# Patient Record
Sex: Female | Born: 1995 | Hispanic: Yes | Marital: Single | Smoking: Never smoker
Health system: Southern US, Community
[De-identification: ages and names within clinical notes are randomized; demographics above are authoritative.]

## PROBLEM LIST (undated history)

## (undated) DIAGNOSIS — Z789 Other specified health status: Secondary | ICD-10-CM

## (undated) HISTORY — PX: NO PAST SURGERIES: SHX2092

---
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
## 2018-08-11 ENCOUNTER — Encounter (HOSPITAL_COMMUNITY): Payer: Self-pay | Admitting: Emergency Medicine

## 2018-08-11 ENCOUNTER — Emergency Department (HOSPITAL_COMMUNITY): Payer: BLUE CROSS/BLUE SHIELD

## 2018-08-11 ENCOUNTER — Other Ambulatory Visit: Payer: Self-pay

## 2018-08-11 ENCOUNTER — Emergency Department (HOSPITAL_COMMUNITY)
Admission: EM | Admit: 2018-08-11 | Discharge: 2018-08-11 | Disposition: A | Discharge status: Home / Self Care | Payer: BLUE CROSS/BLUE SHIELD | Attending: Emergency Medicine | Admitting: Emergency Medicine

## 2018-08-11 DIAGNOSIS — R112 Nausea with vomiting, unspecified: Secondary | ICD-10-CM | POA: Insufficient documentation

## 2018-08-11 DIAGNOSIS — N200 Calculus of kidney: Secondary | ICD-10-CM | POA: Insufficient documentation

## 2018-08-11 DIAGNOSIS — R1031 Right lower quadrant pain: Secondary | ICD-10-CM | POA: Insufficient documentation

## 2018-08-11 LAB — COMPREHENSIVE METABOLIC PANEL
ALBUMIN: 4.4 g/dL (ref 3.5–5.0)
ALT: 23 U/L (ref 0–44)
ANION GAP: 10 (ref 5–15)
AST: 28 U/L (ref 15–41)
Alkaline Phosphatase: 79 U/L (ref 38–126)
BUN: 8 mg/dL (ref 6–20)
CO2: 22 mmol/L (ref 22–32)
Calcium: 10.8 mg/dL — ABNORMAL HIGH (ref 8.9–10.3)
Chloride: 106 mmol/L (ref 98–111)
Creatinine, Ser: 0.78 mg/dL (ref 0.44–1.00)
GFR calc non Af Amer: >60 mL/min (ref >60)
Glucose, Bld: 124 mg/dL — ABNORMAL HIGH (ref 70–99)
Potassium: 3.7 mmol/L (ref 3.5–5.1)
SODIUM: 138 mmol/L (ref 135–145)
Total Bilirubin: 0.8 mg/dL (ref 0.3–1.2)
Total Protein: 7.4 g/dL (ref 6.5–8.1)

## 2018-08-11 LAB — TYPE AND SCREEN
ABO/RH(D): O POS
ANTIBODY SCREEN: NEGATIVE

## 2018-08-11 LAB — URINALYSIS, ROUTINE W REFLEX MICROSCOPIC
BILIRUBIN URINE: NEGATIVE
Glucose, UA: NEGATIVE mg/dL
Ketones, ur: NEGATIVE mg/dL
Nitrite: NEGATIVE
Protein, ur: NEGATIVE mg/dL
RBC / HPF: >50 RBC/hpf — ABNORMAL HIGH (ref 0–5)
Specific Gravity, Urine: 1.009 (ref 1.005–1.030)
pH: 6 (ref 5.0–8.0)

## 2018-08-11 LAB — CBC WITH DIFFERENTIAL/PLATELET
Abs Immature Granulocytes: 0.11 10*3/uL — ABNORMAL HIGH (ref 0.00–0.07)
BASOS ABS: 0.1 10*3/uL (ref 0.0–0.1)
BASOS PCT: 1 %
Eosinophils Absolute: 0.2 10*3/uL (ref 0.0–0.5)
Eosinophils Relative: 2 %
HCT: 42.8 % (ref 36.0–46.0)
Hemoglobin: 13.8 g/dL (ref 12.0–15.0)
Immature Granulocytes: 1 %
LYMPHS PCT: 27 %
Lymphs Abs: 3.5 10*3/uL (ref 0.7–4.0)
MCH: 28.6 pg (ref 26.0–34.0)
MCHC: 32.2 g/dL (ref 30.0–36.0)
MCV: 88.6 fL (ref 80.0–100.0)
MONO ABS: 1 10*3/uL (ref 0.1–1.0)
Monocytes Relative: 8 %
NRBC: 0 % (ref 0.0–0.2)
Neutro Abs: 8.2 10*3/uL — ABNORMAL HIGH (ref 1.7–7.7)
Neutrophils Relative %: 61 %
PLATELETS: 408 10*3/uL — AB (ref 150–400)
RBC: 4.83 MIL/uL (ref 3.87–5.11)
RDW: 12.5 % (ref 11.5–15.5)
WBC: 13.1 10*3/uL — ABNORMAL HIGH (ref 4.0–10.5)

## 2018-08-11 LAB — LIPASE, BLOOD: Lipase: 21 U/L (ref 11–51)

## 2018-08-11 LAB — I-STAT CG4 LACTIC ACID, ED
LACTIC ACID, VENOUS: 2.52 mmol/L — AB (ref 0.5–1.9)
LACTIC ACID, VENOUS: 2.03 mmol/L — AB (ref 0.5–1.9)

## 2018-08-11 LAB — ABO/RH: ABO/RH(D): O POS

## 2018-08-11 LAB — HCG, SERUM, QUALITATIVE: Preg, Serum: NEGATIVE

## 2018-08-11 MED ORDER — MORPHINE SULFATE (PF) 4 MG/ML IV SOLN
4.0000 mg | Freq: Once | INTRAVENOUS | Status: AC
  Administered 2018-08-11: 4 mg via INTRAVENOUS
  Filled 2018-08-11: qty 1

## 2018-08-11 MED ORDER — KETOROLAC TROMETHAMINE 30 MG/ML IJ SOLN
30.0000 mg | Freq: Once | INTRAMUSCULAR | Status: AC
  Administered 2018-08-11: 30 mg via INTRAVENOUS
  Filled 2018-08-11: qty 1

## 2018-08-11 MED ORDER — SODIUM CHLORIDE 0.9 % IV BOLUS
1000.0000 mL | Freq: Once | INTRAVENOUS | Status: AC
  Administered 2018-08-11: 1000 mL via INTRAVENOUS

## 2018-08-11 MED ORDER — OXYCODONE-ACETAMINOPHEN 5-325 MG PO TABS: 1.0000 | ORAL_TABLET | ORAL | 0 refills | Status: DC | PRN

## 2018-08-11 MED ORDER — IOPAMIDOL (ISOVUE-300) INJECTION 61%
100.0000 mL | Freq: Once | INTRAVENOUS | Status: AC | PRN
  Administered 2018-08-11: 100 mL via INTRAVENOUS

## 2018-08-11 MED ORDER — CEPHALEXIN 500 MG PO CAPS: 500.0000 mg | ORAL_CAPSULE | Freq: Two times a day (BID) | ORAL | 0 refills | Status: AC

## 2018-08-11 MED ORDER — ONDANSETRON HCL 4 MG/2ML IJ SOLN
4.0000 mg | Freq: Once | INTRAMUSCULAR | Status: AC
  Administered 2018-08-11: 4 mg via INTRAVENOUS
  Filled 2018-08-11: qty 2

## 2018-08-11 MED ORDER — ONDANSETRON HCL 4 MG PO TABS: 4.0000 mg | ORAL_TABLET | Freq: Three times a day (TID) | ORAL | 0 refills | Status: DC | PRN

## 2018-08-11 MED ORDER — TAMSULOSIN HCL 0.4 MG PO CAPS: 0.4000 mg | ORAL_CAPSULE | Freq: Every day | ORAL | 0 refills | Status: DC

## 2018-08-11 NOTE — Discharge Instructions (Addendum)
Your work-up today shows evidence of kidney stone.  There was also some urine backed up in your kidney.  Your kidney function was normal however after I spoke to urology they recommended we treat you with antibiotics, pain medicine, nausea medicine, and Flomax to try and pass this at home.  As your pain is improved we feel you are safe for discharge home.  If your symptoms worsen and are not controlled by the medications, if you develop fevers, or if anything changes or worsen significantly, please return to the nearest emergency department immediately.

## 2018-08-11 NOTE — ED Provider Notes (Signed)
[facility] EMERGENCY DEPARTMENT Provider Note   CSN: [id number] Arrival date & time: 08/11/18  0705     History   Chief Complaint Chief Complaint  Patient presents with  . Abdominal Pain  . Flank Pain    HPI Sherri Burton is a 22 y.o. female.  The history is provided by the patient, a parent and medical records. No language interpreter was used.  Abdominal Pain   This is a new problem. The current episode started 3 to 5 hours ago. The problem occurs constantly. The problem has not changed since onset.The pain is associated with an unknown factor. The pain is located in the RLQ. The quality of the pain is sharp and aching. The pain is at a severity of 8/10. The pain is severe. Associated symptoms include anorexia, nausea and vomiting. Pertinent negatives include fever, diarrhea, constipation, dysuria, frequency, hematuria and headaches. The symptoms are aggravated by palpation. Nothing relieves the symptoms.    History reviewed. No pertinent past medical history.  There are no active problems to display for this patient.   History reviewed. No pertinent surgical history.   OB History   No obstetric history on file.      Home Medications    Prior to Admission medications   Not on File    Family History History reviewed. No pertinent family history.  Social History Social History   Tobacco Use  . Smoking status: Never Smoker  Substance Use Topics  . Alcohol use: Yes    Alcohol/week: 1.0 standard drinks    Types: 1 Standard drinks or equivalent per week  . Drug use: Never     Allergies   Patient has no known allergies.   Review of Systems Review of Systems  Constitutional: Negative for chills, diaphoresis, fatigue and fever.  HENT: Negative for congestion.   Respiratory: Negative for cough, chest tightness, shortness of breath and wheezing.   Cardiovascular: Negative for chest pain, palpitations and leg swelling.    Gastrointestinal: Positive for abdominal pain, anorexia, nausea and vomiting. Negative for constipation and diarrhea.  Genitourinary: Negative for dysuria, flank pain, frequency and hematuria.  Musculoskeletal: Negative for back pain, neck pain and neck stiffness.  Skin: Negative for rash and wound.  Neurological: Negative for light-headedness, numbness and headaches.  Psychiatric/Behavioral: Negative for agitation.  All other systems reviewed and are negative.    Physical Exam Updated Vital Signs BP (!) 160/109 (BP Location: Right Arm)   Pulse 93   Temp 98.2 F (36.8 C) (Oral)   Resp 20   Ht 5\' 2"  (1.575 m)   Wt 82.6 kg   LMP 02/08/2017 (Approximate)   SpO2 100%   BMI 33.29 kg/m   Physical Exam Vitals signs and nursing note reviewed.  Constitutional:      General: She is not in acute distress.    Appearance: She is well-developed. She is ill-appearing. She is not toxic-appearing or diaphoretic.  HENT:     Head: Normocephalic and atraumatic.  Eyes:     Conjunctiva/sclera: Conjunctivae normal.  Neck:     Musculoskeletal: Neck supple.  Cardiovascular:     Rate and Rhythm: Normal rate and regular rhythm.     Heart sounds: No murmur.  Pulmonary:     Effort: Pulmonary effort is normal. No respiratory distress.     Breath sounds: Normal breath sounds. No wheezing, rhonchi or rales.  Chest:     Chest wall: No tenderness.  Abdominal:     General: Abdomen is flat.  Bowel sounds are normal. There is no distension.     Palpations: Abdomen is soft.     Tenderness: There is abdominal tenderness in the right lower quadrant. There is right CVA tenderness. There is no left CVA tenderness, guarding or rebound.  Skin:    General: Skin is warm and dry.     Capillary Refill: Capillary refill takes less than 2 seconds.  Neurological:     Mental Status: She is alert.      ED Treatments / Results  Labs (all labs ordered are listed, but only abnormal results are displayed) Labs  Reviewed  CBC WITH DIFFERENTIAL/PLATELET - Abnormal; Notable for the following components:      Result Value   WBC 13.1 (*)    Platelets 408 (*)    Neutro Abs 8.2 (*)    Abs Immature Granulocytes 0.11 (*)    All other components within normal limits  COMPREHENSIVE METABOLIC PANEL - Abnormal; Notable for the following components:   Glucose, Bld 124 (*)    Calcium 10.8 (*)    All other components within normal limits  URINALYSIS, ROUTINE W REFLEX MICROSCOPIC - Abnormal; Notable for the following components:   Hgb urine dipstick LARGE (*)    Leukocytes, UA TRACE (*)    RBC / HPF >50 (*)    Bacteria, UA FEW (*)    All other components within normal limits  I-STAT CG4 LACTIC ACID, ED - Abnormal; Notable for the following components:   Lactic Acid, Venous 2.52 (*)    All other components within normal limits  I-STAT CG4 LACTIC ACID, ED - Abnormal; Notable for the following components:   Lactic Acid, Venous 2.03 (*)    All other components within normal limits  URINE CULTURE  LIPASE, BLOOD  HCG, SERUM, QUALITATIVE  TYPE AND SCREEN  ABO/RH    EKG None  Radiology Ct Abdomen Pelvis W Contrast  Result Date: 08/11/2018 CLINICAL DATA:  Right upper quadrant abdominal pain, right flank pain EXAM: CT ABDOMEN AND PELVIS WITH CONTRAST TECHNIQUE: Multidetector CT imaging of the abdomen and pelvis was performed using the standard protocol following bolus administration of intravenous contrast. CONTRAST:  164mL ISOVUE-300 IOPAMIDOL (ISOVUE-300) INJECTION 61% COMPARISON:  None. FINDINGS: Lower chest: Lung bases are clear. No effusions. Heart is normal size. Hepatobiliary: No focal hepatic abnormality. Gallbladder unremarkable. Pancreas: No focal abnormality or ductal dilatation. Spleen: No focal abnormality.  Normal size. Adrenals/Urinary Tract: 3-4 mm proximal right ureteral stone with mild right hydronephrosis and perinephric stranding. No additional renal or ureteral stones. No hydronephrosis on  the left. Adrenal glands and urinary bladder unremarkable. Stomach/Bowel: Normal appendix. Stomach, large and small bowel grossly unremarkable. Vascular/Lymphatic: No evidence of aneurysm or adenopathy. Reproductive: Uterus and adnexa unremarkable.  No mass. Other: No free fluid or free air. Musculoskeletal: No acute bony abnormality. IMPRESSION: 3-4 mm proximal right ureteral stone with mild right hydronephrosis and perinephric stranding. Electronically Signed   By: Rolm Baptise M.D.   On: 08/11/2018 09:41    Procedures Procedures (including critical care time)  Medications Ordered in ED Medications  sodium chloride 0.9 % bolus 1,000 mL (0 mLs Intravenous Stopped 08/11/18 1130)  morphine 4 MG/ML injection 4 mg (4 mg Intravenous Given 08/11/18 0741)  ondansetron (ZOFRAN) injection 4 mg (4 mg Intravenous Given 08/11/18 0741)  iopamidol (ISOVUE-300) 61 % injection 100 mL (100 mLs Intravenous Contrast Given 08/11/18 0925)  ketorolac (TORADOL) 30 MG/ML injection 30 mg (30 mg Intravenous Given 08/11/18 1129)     Initial  Impression / Assessment and Plan / ED Course  I have reviewed the triage vital signs and the nursing notes.  Pertinent labs & imaging results that were available during my care of the patient were reviewed by me and considered in my medical decision making (see chart for details).     Kindel Rochefort is a 22 y.o. female with no significant past medical history who presents with nausea, vomiting, and severe right-sided abdominal pain.  Patient reports that she was feeling normal until 430 in this morning when she woke up having severe right abdomen and right lower quadrant abdominal pain.  She reports the pain is 8 out of 10 in severity and is aching and sharp.  She reports one episode of nausea and vomiting that looked dark.  She is unsure if it was bloody.  She denies any constipation or diarrhea and a normal bowel movement this morning trying to help with symptoms.  She denies  any urinary symptoms with no hematuria.  No history of kidney stones.  No prior history of appendicitis, gallbladder disease, diverticulitis, or other abdominal problems.  She denies preceding fevers or chills.  She denies any chest pain, shortness breath, congestion, or cough.  Denies recent trauma.  Denies any back pain.  Denies any other complaints.  She reports she has not had a menstrual cycle for over a year as she is on birth control.  She denies vaginal bleeding or vaginal discharge or other pelvic symptoms.  On exam, patient has exquisite tenderness in her right lower quadrant.  Patient has no left-sided abdominal tenderness.  Lungs clear and chest was nontender.  Bowel sounds were present.  Patient has no leg tenderness or leg swelling.  Patient does have some right CVA tenderness that is mild.  No left-sided symptoms.  Clinically most concerned about appendicitis.  Patient will have symptomatic medications with nausea medicine, pain medicine, and fluids.  She will be made n.p.o.  Patient will have labs and CT scan to further evaluate.  If CT is negative, will consider ultrasound to rule out gallbladder pathology missed on CT. suspect any kidney stone will be seen on CT.  10:01 AM Lactic acid elevated 2.52.  Fluids be continued.  Patient has leukocytosis of 13.  No anemia.  Metabolic panel shows slight hypercalcemia but otherwise normal kidney function liver function.  Lipase not elevated.  Patient is nonpregnant.  CT scan shows normal appendix with no appendicitis however it did show evidence of a proximal right 3 to 4 mm kidney stone with hydronephrosis and stranding.  Patient is awaiting urinalysis to determine if there is an infected stone present.  If there is an infected stone, will likely touch base with urology, if urine is reassuring, anticipate attempt at medical expulsion therapy as an outpatient and urology follow-up.  12:50 PM Urinalysis shows no nitrites.  There was evidence  of leukocytes and bacteria as well as bleeding.  Based on lack of fevers, chills, or dysuria, have lower suspicion for infected stone at this time.  Patient reports her pain is completely resolved and she was able to eat and drink without difficulty.  Suspect patient may have already passed stone versus pain is intermittent.  Lactic acid improved on reassessment.  Due to concern for possible infected stone, will touch base with urology.  As her pain is improved I suspect will be stable for discharge home for outpatient medical expulsion therapy.  Urology recommended antibiotics and discharge with follow-up in clinic.  As her  pain improved with a fill she was stable for discharge home and I agreed.  Patient had resolution of her discomfort.  Patient given prescription for pain medicine, nausea medicine, Flomax, and antibiotics.  Patient understood return precautions and was discharged in good condition.  Final Clinical Impressions(s) / ED Diagnoses   Final diagnoses:  Right lower quadrant abdominal pain  Non-intractable vomiting with nausea, unspecified vomiting type  Kidney stone    ED Discharge Orders         Ordered    cephALEXin (KEFLEX) 500 MG capsule  2 times daily     08/11/18 1339    oxyCODONE-acetaminophen (PERCOCET/ROXICET) 5-325 MG tablet  Every 4 hours PRN     08/11/18 1339    ondansetron (ZOFRAN) 4 MG tablet  Every 8 hours PRN     08/11/18 1339    tamsulosin (FLOMAX) 0.4 MG CAPS capsule  Daily     08/11/18 1340         Clinical Impression: 1. Right lower quadrant abdominal pain   2. Non-intractable vomiting with nausea, unspecified vomiting type   3. Kidney stone     Disposition: Discharge  Condition: Good  I have discussed the results, Dx and Tx plan with the pt(& family if present). He/she/they expressed understanding and agree(s) with the plan. Discharge instructions discussed at great length. Strict return precautions discussed and pt &/or family have  verbalized understanding of the instructions. No further questions at time of discharge.    Discharge Medication List as of 08/11/2018  1:59 PM    START taking these medications   Details  cephALEXin (KEFLEX) 500 MG capsule Take 1 capsule (500 mg total) by mouth 2 (two) times daily for 7 days., Starting Tue 08/11/2018, Until Tue 08/18/2018, Print    ondansetron (ZOFRAN) 4 MG tablet Take 1 tablet (4 mg total) by mouth every 8 (eight) hours as needed., Starting Tue 08/11/2018, Print    oxyCODONE-acetaminophen (PERCOCET/ROXICET) 5-325 MG tablet Take 1 tablet by mouth every 4 (four) hours as needed for severe pain., Starting Tue 08/11/2018, Print    tamsulosin (FLOMAX) 0.4 MG CAPS capsule Take 1 capsule (0.4 mg total) by mouth daily., Starting Tue 08/11/2018, Print        Follow Up: [facility]       , Gwenyth Allegra, MD 08/11/18 [phone]

## 2018-08-11 NOTE — ED Notes (Signed)
Patient given discharge instructions and verbalized understanding.  Patient stable to discharge at this time.  Patient is alert and oriented to baseline.  No distressed noted at this time.  All belongings taken with the patient at discharge.   

## 2018-08-11 NOTE — ED Triage Notes (Addendum)
Per Pt she woke up around 0430 with RUQ abdominal pain and right flank pain 7/10 sharp pain.  NAD noted at this time pt able to ambulate with out assistance did throw up once while in waiting room and reports the color was black.

## 2018-08-11 NOTE — ED Notes (Signed)
I-stat lactic acid results 2.03 reported Teagler, MD

## 2018-08-12 LAB — URINE CULTURE: Culture: NO GROWTH

## 2020-01-29 IMAGING — CT CT ABD-PELV W/ CM
2 of 4 series · 16 of 46 positions shown, 18 images · IV contrast (iopamidol)
Comparison: None.

CLINICAL DATA: Right upper quadrant abdominal pain, right flank
pain

EXAM:
CT ABDOMEN AND PELVIS WITH CONTRAST
TECHNIQUE: Multidetector CT imaging of the abdomen and pelvis was performed
using the standard protocol following bolus administration of
intravenous contrast.
CONTRAST:  100mL 1F5SKS-OTT IOPAMIDOL (1F5SKS-OTT) INJECTION 61%

[Series 3: a/p w/ 5mm · axial · 0.96mm/px · z∈[+902,+1397]mm · 13 of 109 slices shown, 15 images]
[im 5/109  soft-tissue]
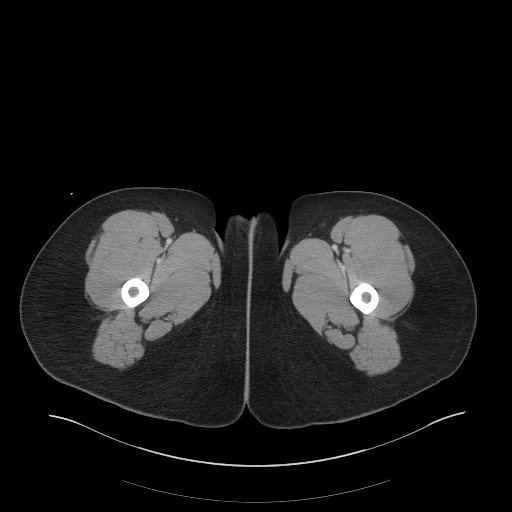
[im 5/109  bone]
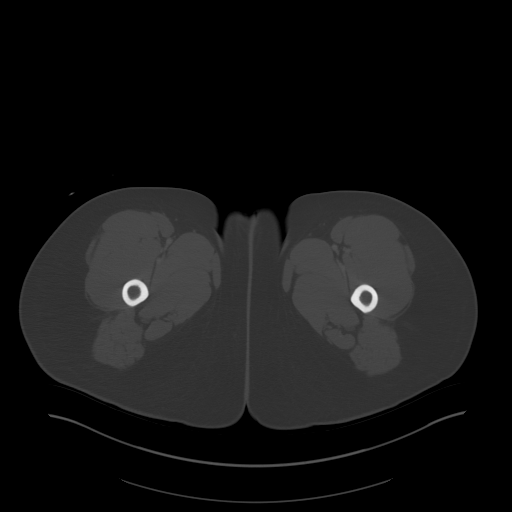
[im 13/109  soft-tissue]
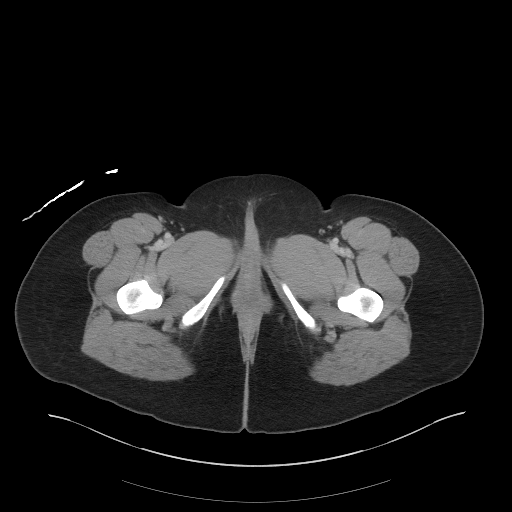
[im 21/109  soft-tissue]
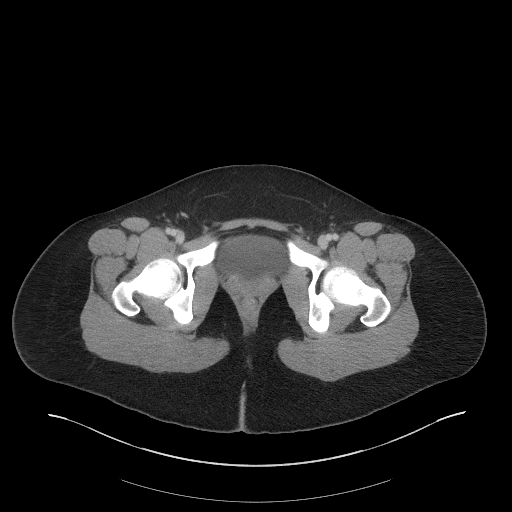
[im 30/109  soft-tissue]
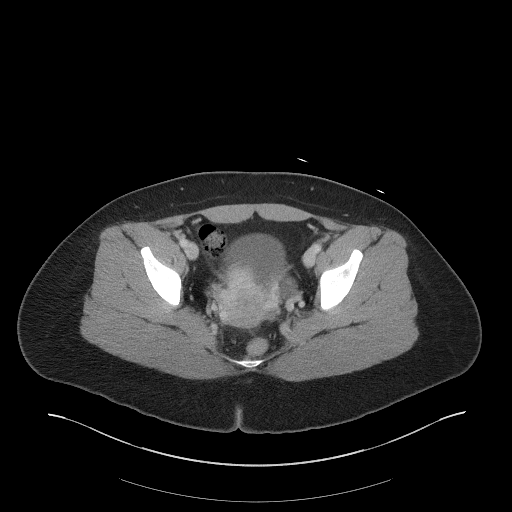
[im 38/109  soft-tissue]
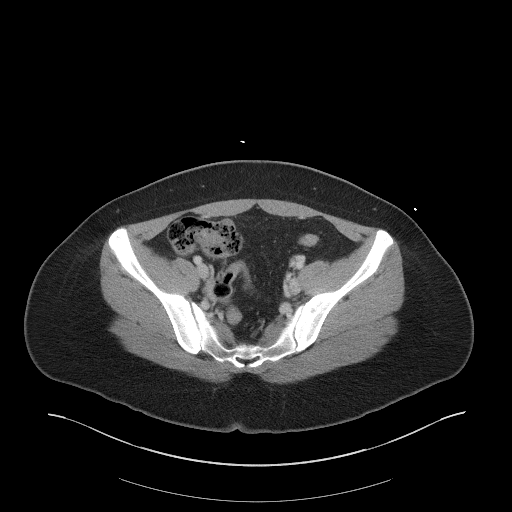
[im 46/109  soft-tissue]
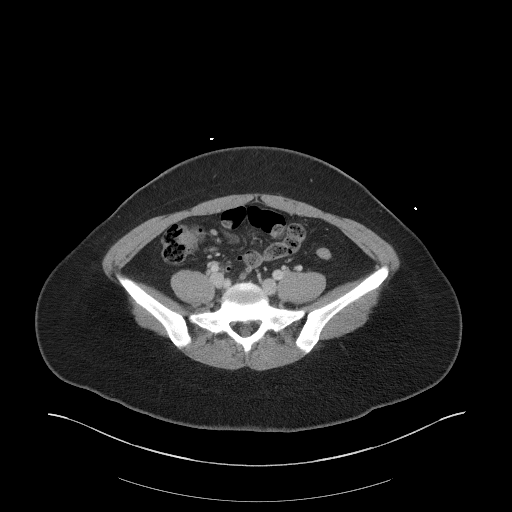
[im 55/109  soft-tissue]
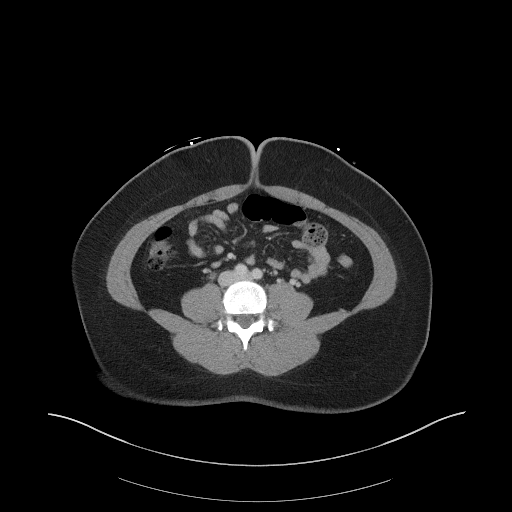
[im 63/109  soft-tissue]
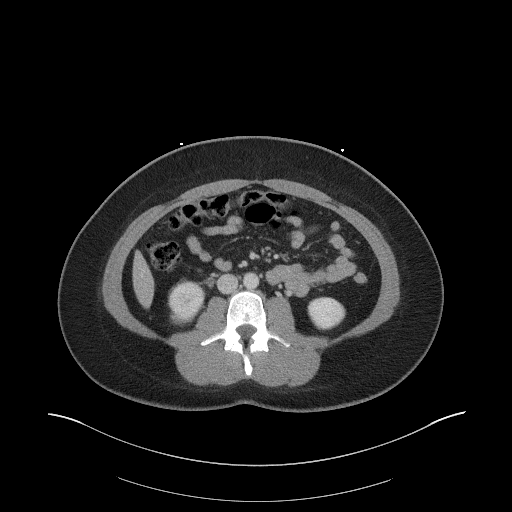
[im 71/109  soft-tissue]
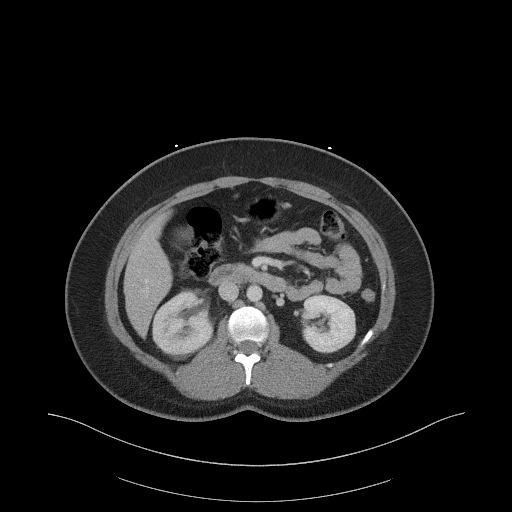
[im 71/109  bone]
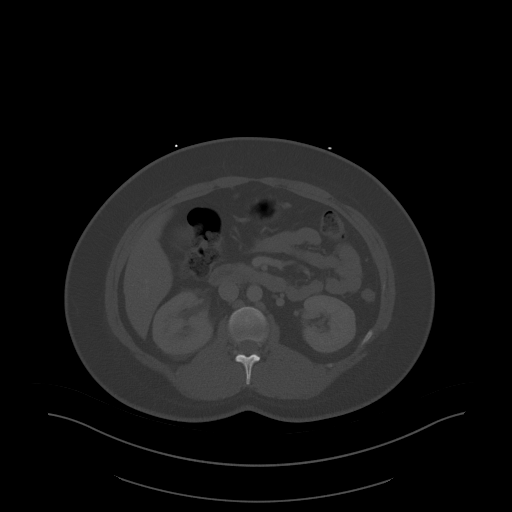
[im 79/109  soft-tissue]
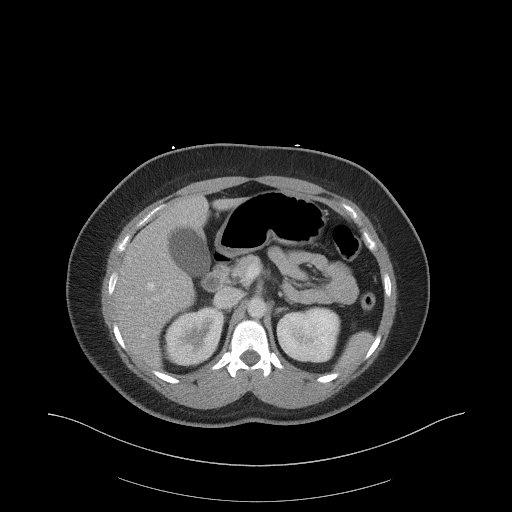
[im 88/109  soft-tissue]
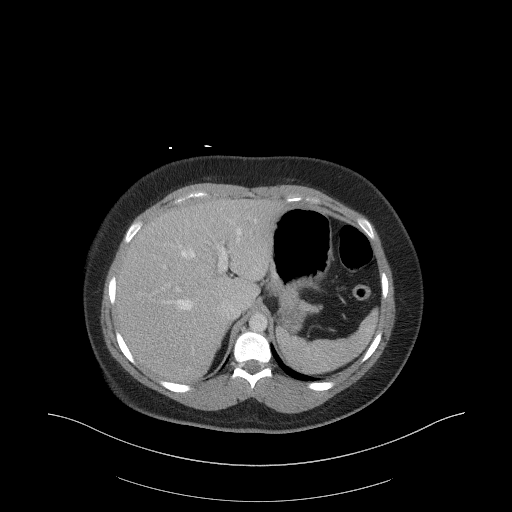
[im 96/109  soft-tissue]
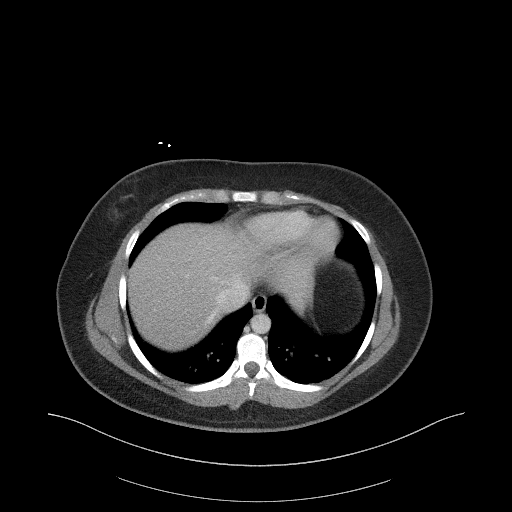
[im 104/109  soft-tissue]
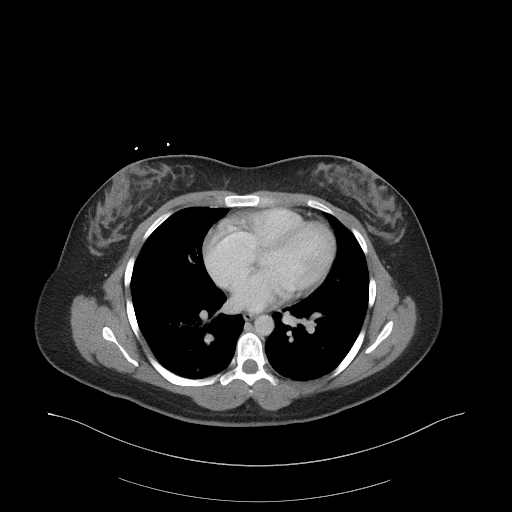

[Series 6: a/p w/ cor · coronal · 0.97mm/px · 3 of 186 slices shown]
[im 62/186  soft-tissue]
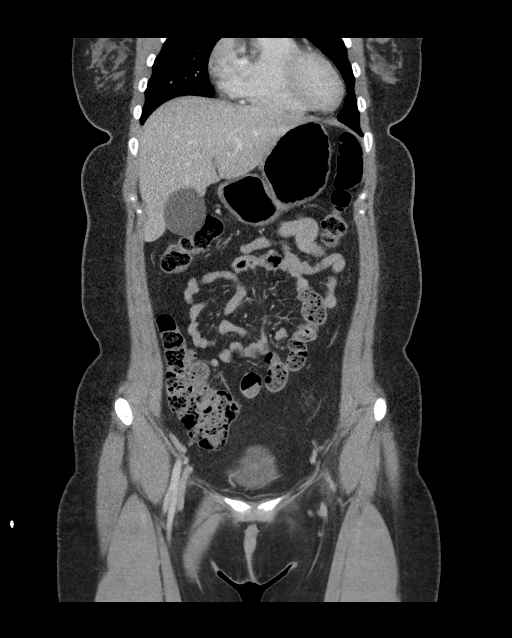
[im 83/186  soft-tissue]
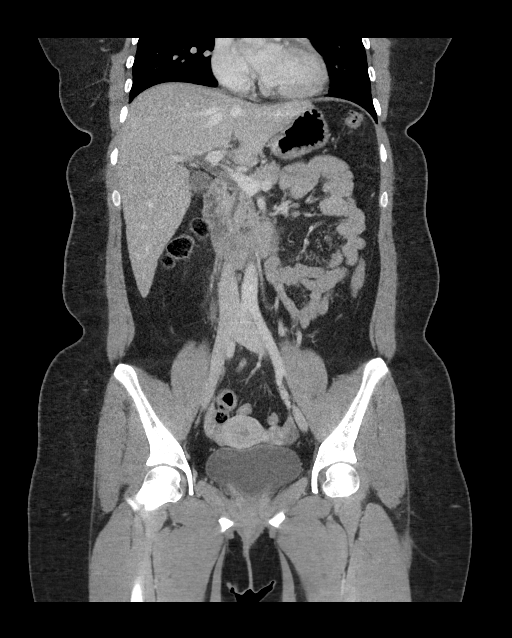
[im 103/186  soft-tissue]
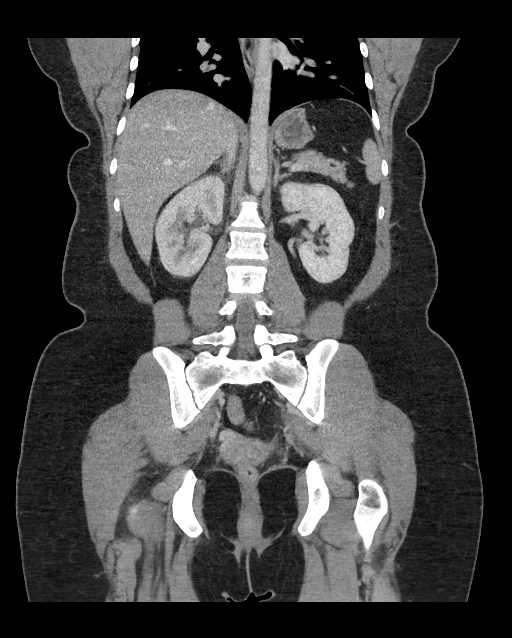

[16 of 46 positions shown; findings below may reference images not displayed]

FINDINGS: Lower chest: Lung bases are clear. No effusions. Heart is normal
size.

Hepatobiliary: No focal hepatic abnormality. Gallbladder
unremarkable.

Pancreas: No focal abnormality or ductal dilatation.

Spleen: No focal abnormality.  Normal size.

Adrenals/Urinary Tract: 3-4 mm proximal right ureteral stone with
mild right hydronephrosis and perinephric stranding. No additional
renal or ureteral stones. No hydronephrosis on the left. Adrenal
glands and urinary bladder unremarkable.

Stomach/Bowel: Normal appendix. Stomach, large and small bowel
grossly unremarkable.

Vascular/Lymphatic: No evidence of aneurysm or adenopathy.

Reproductive: Uterus and adnexa unremarkable.  No mass.

Other: No free fluid or free air.

Musculoskeletal: No acute bony abnormality.
IMPRESSION: 3-4 mm proximal right ureteral stone with mild right hydronephrosis
and perinephric stranding.

## 2021-09-11 ENCOUNTER — Ambulatory Visit
Admission: RE | Admit: 2021-09-11 | Discharge: 2021-09-11 | Disposition: A | Discharge status: Home / Self Care | Payer: BC Managed Care – PPO | Source: Ambulatory Visit | Attending: Emergency Medicine | Admitting: Emergency Medicine

## 2021-09-11 ENCOUNTER — Other Ambulatory Visit: Payer: Self-pay

## 2021-09-11 VITALS — BP 135/87 | HR 77 | Temp 98.3°F | Resp 16

## 2021-09-11 DIAGNOSIS — Z3201 Encounter for pregnancy test, result positive: Secondary | ICD-10-CM | POA: Diagnosis not present

## 2021-09-11 LAB — POCT URINE PREGNANCY: Preg Test, Ur: POSITIVE — AB

## 2021-09-11 MED ORDER — PRENATAL VITAMIN 27-0.8 MG PO TABS: 1.0000 | ORAL_TABLET | Freq: Every day | ORAL | 0 refills | Status: DC

## 2021-09-11 NOTE — ED Provider Notes (Signed)
HPI  SUBJECTIVE:  Sherri Burton is a 26 y.o. female who presents for pregnancy test.  She had 2 positive home pregnancy test and 1 negative one.  LMP: December 17, she is normally irregular.  She has not been on any birth control since November.  She reports increased sensitivity to smells and breast tenderness starting last week.  No nausea, vomiting, abdominal pain, cramping, contractions, vaginal bleeding, odor, discharge.  She is in a long-term monogamous relationship with her husband.  She has never been pregnant before.  She has no past medical history.  No history of STDs, PID.  PMD: [employer] community.    History reviewed. No pertinent past medical history.  History reviewed. No pertinent surgical history.  Family History  Problem Relation Age of Onset   Cancer Mother    Healthy Father     Social History   Tobacco Use   Smoking status: Never  Substance Use Topics   Alcohol use: Yes    Alcohol/week: 1.0 standard drink    Types: 1 Standard drinks or equivalent per week   Drug use: Never    No current facility-administered medications for this encounter.  Current Outpatient Medications:    Prenatal Vit-Fe Fumarate-FA (PRENATAL VITAMIN) 27-0.8 MG TABS, Take 1 tablet by mouth daily., Disp: 30 tablet, Rfl: 0  No Known Allergies   ROS  As noted in HPI.   Physical Exam  BP 135/87 (BP Location: Right Arm)    Pulse 77    Temp 98.3 F (36.8 C) (Oral)    Resp 16    LMP 07/28/2021 (Exact Date)    SpO2 98%   Constitutional: Well developed, well nourished, no acute distress Eyes:  EOMI, conjunctiva normal bilaterally HENT: Normocephalic, atraumatic,mucus membranes moist Respiratory: Normal inspiratory effort Cardiovascular: Normal rate GI: nondistended soft.  No suprapubic, flank tenderness. Back: No CVAT skin: No rash, skin intact Musculoskeletal: no deformities Neurologic: Alert & oriented x 3, no focal neuro deficits Psychiatric: Speech and behavior  appropriate   ED Course   Medications - No data to display  Orders Placed This Encounter  Procedures   POCT urine pregnancy    Standing Status:   Standing    Number of Occurrences:   1    Results for orders placed or performed during the hospital encounter of 09/11/21 (from the past 24 hour(s))  POCT urine pregnancy     Status: Abnormal   Collection Time: 09/11/21  6:01 PM  Result Value Ref Range   Preg Test, Ur Positive (A) Negative   No results found.  ED Clinical Impression  1. Positive urine pregnancy test      ED Assessment/Plan  Urine pregnancy positive.  [facility] [place] [facility] steps.  will start her on prenatal vitamins in the meantime.   Discussed labs,  MDM, treatment plan, and plan for follow-up with patient. Discussed sn/sx that should prompt return to the ED. patient agrees with plan.   Meds ordered this encounter  Medications   Prenatal Vit-Fe Fumarate-FA (PRENATAL VITAMIN) 27-0.8 MG TABS    Sig: Take 1 tablet by mouth daily.    Dispense:  30 tablet    Refill:  0      *This clinic note was created using [employer]. Therefore, there may be occasional mistakes despite careful proofreading.  ?    Melynda Ripple, MD 09/13/21 [phone]

## 2021-09-11 NOTE — Discharge Instructions (Addendum)
[facility] [place] ASAP for next steps.  You can take the prenatal vitamins.  Any brand of prenatal vitamin will do.  It may cause constipation as it has extra iron in it.  Go immediately to the MAU at [facility] if you start having vaginal bleeding, abdominal pain, if you pass out, or for other concerns

## 2021-09-11 NOTE — ED Triage Notes (Signed)
Pt presents for pregnancy test. States she took 3 pregnancy teste between yesterday and today, 2 were positive 1 was negative.

## 2021-09-17 ENCOUNTER — Inpatient Hospital Stay (HOSPITAL_COMMUNITY)
Admission: EM | Admit: 2021-09-17 | Discharge: 2021-09-17 | Disposition: A | Discharge status: Home / Self Care | Payer: BC Managed Care – PPO | Attending: Family Medicine | Admitting: Family Medicine

## 2021-09-17 ENCOUNTER — Encounter (HOSPITAL_COMMUNITY): Payer: Self-pay | Admitting: Physician Assistant

## 2021-09-17 ENCOUNTER — Inpatient Hospital Stay (HOSPITAL_COMMUNITY): Payer: BC Managed Care – PPO

## 2021-09-17 ENCOUNTER — Other Ambulatory Visit: Payer: Self-pay

## 2021-09-17 ENCOUNTER — Ambulatory Visit
Admission: EM | Admit: 2021-09-17 | Discharge: 2021-09-17 | Disposition: A | Discharge status: Home / Self Care | Payer: BC Managed Care – PPO

## 2021-09-17 DIAGNOSIS — O0281 Inappropriate change in quantitative human chorionic gonadotropin (hCG) in early pregnancy: Secondary | ICD-10-CM

## 2021-09-17 DIAGNOSIS — O039 Complete or unspecified spontaneous abortion without complication: Secondary | ICD-10-CM | POA: Insufficient documentation

## 2021-09-17 DIAGNOSIS — O209 Hemorrhage in early pregnancy, unspecified: Secondary | ICD-10-CM

## 2021-09-17 HISTORY — DX: Other specified health status: Z78.9

## 2021-09-17 LAB — URINALYSIS, MICROSCOPIC (REFLEX): RBC / HPF: >50 RBC/hpf (ref 0–5)

## 2021-09-17 LAB — WET PREP, GENITAL
Clue Cells Wet Prep HPF POC: NONE SEEN
Sperm: NONE SEEN
Trich, Wet Prep: NONE SEEN
WBC, Wet Prep HPF POC: <10 (ref <10)
Yeast Wet Prep HPF POC: NONE SEEN

## 2021-09-17 LAB — CBC
HCT: 40 % (ref 36.0–46.0)
Hemoglobin: 13.1 g/dL (ref 12.0–15.0)
MCH: 29.2 pg (ref 26.0–34.0)
MCHC: 32.8 g/dL (ref 30.0–36.0)
MCV: 89.3 fL (ref 80.0–100.0)
Platelets: 432 10*3/uL — ABNORMAL HIGH (ref 150–400)
RBC: 4.48 MIL/uL (ref 3.87–5.11)
RDW: 12.7 % (ref 11.5–15.5)
WBC: 13.3 10*3/uL — ABNORMAL HIGH (ref 4.0–10.5)
nRBC: 0 % (ref 0.0–0.2)

## 2021-09-17 LAB — URINALYSIS, ROUTINE W REFLEX MICROSCOPIC
Bilirubin Urine: NEGATIVE
Glucose, UA: NEGATIVE mg/dL
Ketones, ur: NEGATIVE mg/dL
Leukocytes,Ua: NEGATIVE
Nitrite: NEGATIVE
Protein, ur: 30 mg/dL — AB
Specific Gravity, Urine: >1.03 — ABNORMAL HIGH (ref 1.005–1.030)
pH: 5.5 (ref 5.0–8.0)

## 2021-09-17 LAB — HCG, QUANTITATIVE, PREGNANCY: hCG, Beta Chain, Quant, S: 1 m[IU]/mL (ref <5)

## 2021-09-17 NOTE — MAU Note (Signed)
Presents with c/o VB that began this morning.  States had spotting over the weekend but heavier today.  Denies saturating through sanitary napkin or passing clots.

## 2021-09-17 NOTE — ED Triage Notes (Signed)
Pt arrives POV for eval of vag bleeding and cramping. LMP 12/17, states 7 weeks preg. [facility]

## 2021-09-17 NOTE — ED Provider Notes (Signed)
Emergency Medicine Provider OB Triage Evaluation Note,    Sherri Burton is a 26 y.o. female, G1P0, at Unknown gestation who presents to the emergency department with complaints of vaginal bleeding.  She reports that it started as spotting yesterday and is now bleeding like a period.  She reports cramping. No ultrasounds this pregnancy,   Positive test in the system from 09/11/21  Physical Exam  BP (!) 146/94 (BP Location: Right Arm)    Pulse 98    Temp 98.1 F (36.7 C) (Oral)    Resp 17    LMP 07/28/2021 (Exact Date)    SpO2 98%  temp 98.3, spo2 96 HR 87 BP 146/94 General: Awake, no distress  HEENT: Atraumatic  Resp: Normal effort  Cardiac: Normal rate Abd: Nondistended, nontender  MSK: Moves all extremities without difficulty Neuro: Speech clear  Medical Decision Making  Pt evaluated for pregnancy concern and is stable for transfer to MAU. Pt is in agreement with plan for transfer.  2:22 PM Discussed with MAU APP, Danielle , who accepts patient in transfer.  Clinical Impression   1. Vaginal bleeding affecting early pregnancy        Ollen Gross 09/17/21 1422    Sherwood Gambler, MD 09/18/21 [phone]

## 2021-09-17 NOTE — MAU Provider Note (Addendum)
History     CSN: [id number]  Arrival date and time: 09/17/21 1402   Event Date/Time   First Provider Initiated Contact with Patient 09/17/21 1527      Chief Complaint  Patient presents with   Vaginal Bleeding   Vaginal Bleeding The patient's pertinent negatives include no vaginal discharge. Associated symptoms include abdominal pain. Pertinent negatives include no chills, dysuria, fever, headaches or hematuria.   Sherri Burton is a 26yo G1P0 at [redacted]w[redacted]d who presents for vaginal bleeding and cramping since yesterday. She had some spotting and blood when she wiped yesterday. She found that she had bled through her pants at work today. The quantity of today's bleeding is comparable to the first few days of her period. She has not noticed any clots or tissue. The cramping pain is lower abdominal and is intermittent. She has not had any fevers, itching, burning with urination, or vaginal discharge.   OB History     Gravida  1   Para      Term      Preterm      AB      Living         SAB      IAB      Ectopic      Multiple      Live Births              Past Medical History:  Diagnosis Date   Medical history non-contributory     Past Surgical History:  Procedure Laterality Date   NO PAST SURGERIES      Family History  Problem Relation Age of Onset   Cancer Mother    Healthy Father     Social History   Tobacco Use   Smoking status: Never  Substance Use Topics   Alcohol use: Not Currently    Alcohol/week: 1.0 standard drink    Types: 1 Standard drinks or equivalent per week   Drug use: Never    Allergies: No Known Allergies  Medications Prior to Admission  Medication Sig Dispense Refill Last Dose   Prenatal Vit-Fe Fumarate-FA (PRENATAL VITAMIN) 27-0.8 MG TABS Take 1 tablet by mouth daily. 30 tablet 0 09/16/2021    Review of Systems  Constitutional:  Negative for chills and fever.  Eyes:  Negative for visual disturbance.  Respiratory:   Negative for shortness of breath.   Cardiovascular:  Negative for chest pain.  Gastrointestinal:  Positive for abdominal pain.  Genitourinary:  Positive for vaginal bleeding. Negative for dysuria, hematuria and vaginal discharge.  Neurological:  Negative for headaches.   Physical Exam   Blood pressure 132/85, pulse 84, temperature 98.2 F (36.8 C), temperature source Oral, resp. rate 18, height 5\' 2"  (1.575 m), weight 90.3 kg, last menstrual period 07/28/2021, SpO2 98 %.  Physical Exam Constitutional:      General: She is not in acute distress.    Appearance: Normal appearance. She is not ill-appearing.  HENT:     Head: Normocephalic and atraumatic.  Pulmonary:     Effort: Pulmonary effort is normal.  Abdominal:     Palpations: Abdomen is soft.     Tenderness: There is abdominal tenderness in the suprapubic area.  Skin:    General: Skin is warm and dry.  Neurological:     General: No focal deficit present.     Mental Status: She is alert.    Results for orders placed or performed during the hospital encounter of 09/17/21 (from the past 24 hour(s))  CBC     Status: Abnormal   Collection Time: 09/17/21  3:31 PM  Result Value Ref Range   WBC 13.3 (H) 4.0 - 10.5 K/uL   RBC 4.48 3.87 - 5.11 MIL/uL   Hemoglobin 13.1 12.0 - 15.0 g/dL   HCT 40.0 36.0 - 46.0 %   MCV 89.3 80.0 - 100.0 fL   MCH 29.2 26.0 - 34.0 pg   MCHC 32.8 30.0 - 36.0 g/dL   RDW 12.7 11.5 - 15.5 %   Platelets 432 (H) 150 - 400 K/uL   nRBC 0.0 0.0 - 0.2 %  Wet prep, genital     Status: None   Collection Time: 09/17/21  3:34 PM   Specimen: Vaginal  Result Value Ref Range   Yeast Wet Prep HPF POC NONE SEEN NONE SEEN   Trich, Wet Prep NONE SEEN NONE SEEN   Clue Cells Wet Prep HPF POC NONE SEEN NONE SEEN   WBC, Wet Prep HPF POC <10 <10   Sperm NONE SEEN      US OB LESS THAN 14 WEEKS WITH OB TRANSVAGINAL  Result Date: 09/17/2021 CLINICAL DATA:  Vaginal bleeding, spotting.  Pending beta HCG EXAM: OBSTETRIC <14  WK [place] AND TRANSVAGINAL OB US TECHNIQUE: Both transabdominal and transvaginal ultrasound examinations were performed for complete evaluation of the gestation as well as the maternal uterus, adnexal regions, and pelvic cul-de-sac. Transvaginal technique was performed to assess early pregnancy. COMPARISON:  None. FINDINGS: Intrauterine gestational sac: None Yolk sac:  Not Visualized. Embryo:  Not Visualized. Maternal uterus/adnexae: Anteverted uterus. Endometrial thickness of 5 mm. No intrauterine gestational sac identified. Left ovary measures 2.7 x 2.3 x 2.5 cm and appears unremarkable. Right ovary measures 2.6 x 2.5 x 2.9 cm and appears unremarkable. No adnexal masses. No free fluid within the pelvis. IMPRESSION: No intrauterine pregnancy or findings suspicious for ectopic pregnancy. In the setting of a positive beta HCG, findings would be consistent with pregnancy of unknown location and may reflect early intrauterine pregnancy not yet visualized sonographically, occult ectopic pregnancy, or failed pregnancy. Recommend trending of beta HCG as well as follow-up ultrasound in 7-10 days based on clinical course. Electronically Signed   By: Davina Poke D.O.   On: 09/17/2021 15:55     MAU Course  Procedures  MDM Transabdominal and transvaginal ultrasound showed no intrauterine pregnancy and hCG 1, most suggestive of complete early abortion.  - UA cloudy with large Hgb and protein, likely contamination from vaginal bleeding - CBC with WBC 13.3 and platelets 432 but normal Hgb - hCG 1 - Wet prep normal - GC/chlamydia pending  Assessment and Plan  Complete abortion - With hCG <5, no OB follow-up required - Shared information about [facility] for birth control options and OB/gyn care  Reggy Eye, Medical Student 09/17/2021, 4:11 PM   Attestation of Supervision of Student:  I confirm that I have verified the information documented in the medical students note and that I have also personally reperformed  the history, physical exam and all medical decision making activities.  I have verified that all services and findings are accurately documented in this student's note; and I agree with management and plan as outlined in the documentation. I have also made any necessary editorial changes.  Marcille Buffy DNP, CNM  09/17/21  5:39 PM

## 2021-09-18 LAB — GC/CHLAMYDIA PROBE AMP (~~LOC~~) NOT AT ARMC
Chlamydia: NEGATIVE
Comment: NEGATIVE
Comment: NORMAL
Neisseria Gonorrhea: NEGATIVE

## 2021-10-03 ENCOUNTER — Encounter: Payer: Self-pay | Admitting: *Deleted

## 2021-10-03 ENCOUNTER — Telehealth: Payer: BC Managed Care – PPO

## 2021-10-03 NOTE — Progress Notes (Unsigned)
Per chart review in prep for new ob intake virtual visit discovered patient has miscarriage confirmed by mau visit 09/17/21. Appointments cancelled. Sherri Burton

## 2021-10-17 ENCOUNTER — Encounter: Payer: BC Managed Care – PPO | Admitting: Certified Nurse Midwife

## 2021-11-10 HISTORY — PX: PARATHYROIDECTOMY: SHX19

## 2021-11-13 ENCOUNTER — Ambulatory Visit: Payer: BC Managed Care – PPO | Admitting: Podiatry

## 2021-11-15 ENCOUNTER — Ambulatory Visit: Payer: BC Managed Care – PPO | Admitting: Podiatry

## 2021-11-15 ENCOUNTER — Encounter: Payer: Self-pay | Admitting: Podiatry

## 2021-11-15 DIAGNOSIS — L600 Ingrowing nail: Secondary | ICD-10-CM

## 2021-11-15 MED ORDER — NEOMYCIN-POLYMYXIN-HC 1 % OT SOLN: OTIC | 1 refills | Status: DC

## 2021-11-15 NOTE — Patient Instructions (Signed)

## 2021-11-17 NOTE — Progress Notes (Signed)
?  Subjective:  ?Patient ID: Sherri Burton, female    DOB: 1995/09/30,  MRN: [id number] ?HPI ?Chief Complaint  ?Patient presents with  ? Toe Pain  ?  Hallux bilateral - both borders, ingrown x months, tried clipping  ? New Patient (Initial Visit)  ? ? ?26 y.o. female presents with the above complaint.  ? ?ROS: Denies fever chills nausea vomiting muscle aches pains calf pain back pain chest pain shortness of breath. ? ?Patient confirms that she is not pregnant ? ?Past Medical History:  ?Diagnosis Date  ? Medical history non-contributory   ? ?Past Surgical History:  ?Procedure Laterality Date  ? NO PAST SURGERIES    ? ? ?Current Outpatient Medications:  ?  NEOMYCIN-POLYMYXIN-HYDROCORTISONE (CORTISPORIN) 1 % SOLN OTIC solution, Apply 1-2 drops to toe BID after soaking, Disp: 10 mL, Rfl: 1 ?  NUVARING 0.12-0.015 MG/24HR vaginal ring, SMARTSIG:1 Ring Vaginal Once a Month, Disp: , Rfl:  ?  Prenatal Vit-Fe Fumarate-FA (PRENATAL VITAMIN) 27-0.8 MG TABS, Take 1 tablet by mouth daily., Disp: 30 tablet, Rfl: 0 ? ?No Known Allergies ?Review of Systems ?Objective:  ?There were no vitals filed for this visit. ? ?General: Well developed, nourished, in no acute distress, alert and oriented x3  ? ?Dermatological: Skin is warm, dry and supple bilateral. Nails x 10 are well maintained; remaining integument appears unremarkable at this time. There are no open sores, no preulcerative lesions, no rash or signs of infection present. ? ?Vascular: Dorsalis Pedis artery and Posterior Tibial artery pedal pulses are 2/4 bilateral with immedate capillary fill time. Pedal hair growth present. No varicosities and no lower extremity edema present bilateral.  ? ?Neruologic: Grossly intact via light touch bilateral. Vibratory intact via tuning fork bilateral. Protective threshold with Semmes Wienstein monofilament intact to all pedal sites bilateral. Patellar and Achilles deep tendon reflexes 2+ bilateral. No Babinski or clonus noted bilateral.   ? ?Musculoskeletal: No gross boney pedal deformities bilateral. No pain, crepitus, or limitation noted with foot and ankle range of motion bilateral. Muscular strength 5/5 in all groups tested bilateral. ? ?Gait: Unassisted, Nonantalgic.  ? ? ?Radiographs: ? ?None taken ? ?Assessment & Plan:  ? ?Assessment: Ingrown toenails tibial fibular border of the hallux bilateral ? ?Plan: Chemical matricectomy's were performed today after local anesthetic was administered.  She tolerated procedure well without complications.  Was provided with both oral and home-going instruction for care and soaking of the toe.  She was also provided prescription for Cortisporin Otic to be applied twice daily after soaking she will follow-up with [place] in 2 weeks. ? ? ? ? ?Dorsey Authement T. Brock, DPM ?

## 2021-11-23 ENCOUNTER — Ambulatory Visit: Payer: BC Managed Care – PPO | Admitting: Podiatry

## 2021-11-29 ENCOUNTER — Ambulatory Visit (INDEPENDENT_AMBULATORY_CARE_PROVIDER_SITE_OTHER): Payer: BC Managed Care – PPO | Admitting: Podiatry

## 2021-11-29 ENCOUNTER — Encounter: Payer: Self-pay | Admitting: Podiatry

## 2021-11-29 DIAGNOSIS — L600 Ingrowing nail: Secondary | ICD-10-CM

## 2021-11-29 DIAGNOSIS — Z9889 Other specified postprocedural states: Secondary | ICD-10-CM

## 2021-11-29 NOTE — Progress Notes (Signed)
She presents today for follow-up of her nail procedures hallux bilaterally.  She states that they are doing just fine no problems whatsoever continues to soak them and wrapped them twice daily. ? ?Objective: Vital signs are stable alert and oriented x3.  There is no erythema edema cellulitis drainage or odor she has some scabs on the medial and lateral moral that appear to be healing very nicely.  No purulence no malodor. ? ?Assessment: Well-healing surgical toes hallux bilateral both borders. ? ?Plan: Encouraged her to switch to Epsom salts and warm water continue to soak for about 1 more week cover during the day but leave open at bedtime.  Follow-up with her with any regression or failure to heal completely. ?

## 2022-08-12 NOTE — L&D Delivery Note (Signed)
Operative Delivery Note I was called in to assess vaginal bleeding from a laceration that was noted during perineal massage.  I assessed pt, She had bled 600 cc before I was called and on exam I noted a pumping vessel at torn hymen. I sutured w/ 3-0 Vicryl with some control.  Pt was complete at 8.15 am and intermittently pushing for 3 hrs but continuously for last 2.1/2 hrs.  Station was +1 and +2 with pushing (2/5). She was counseled on VAVD vs C/section. She was counseled on risks and complications of both. She was pushing well but head descent was stalling.  Pt wanted to try one attempt at vacuum and husband was leaning for c-section but agreed to give one attempt.   Verbal consent: obtained from patient.  Risks and benefits discussed in detail.  Risks include, but are not limited to the risks of anesthesia, bleeding, infection, damage to maternal tissues, fetal cephalhematoma.  There is also the risk of inability to effect vaginal delivery of the head, or shoulder dystocia that cannot be resolved by established maneuvers, leading to the need for emergency cesarean section.  Sutures assessed, station assessed, bladder was emptied no too long back. Anesthesia excellent.  At 1:46 PM a viable and healthy female was delivered via Vaginal, Spontaneous.  Presentation: vertex; Position: Occiput,, Anterior; Station: +2. Kiwi used, over one single contraction and 4 pushes, I delivered baby without pop-offs and smoothly. Shoulders followed easily and loose nuchal released and baby placed on Mom.  NICU left room with stable baby.,  APGAR: 9, 9; weight 9 lb 1 oz (4110 g).   Placenta status: , .   Cord:  with the following complications: .  Cord pH: NA  Anesthesia:  Epidural  Instruments: Kiwi Episiotomy: None Lacerations:  vaginal and 2nd degree perineal laceration. Suture Repair: 3.0 vicryl  Est. Blood Loss (mL): 1622 600 cc was before delivery from perineal per QBL. Another 1000 cc was after  delivery. TXA was given at baby delivery Methergine 0.2 mg IM due to atonic uterus and alos 1000 mcg Cytotec placed rectally, manual evacuation of uterus done twice.   Chorioamnionitis suspected with maternal temp and baby and placenta were hot. S So 2 gm Cefoxitiin was ordered  Mom to postpartum.  Baby to Couplet care / Skin to Skin.  Robley Fries 05/09/2023, 2:42 PM

## 2022-10-24 LAB — OB RESULTS CONSOLE HIV ANTIBODY (ROUTINE TESTING): HIV: NONREACTIVE

## 2022-10-24 LAB — OB RESULTS CONSOLE GC/CHLAMYDIA
Chlamydia: NEGATIVE
Neisseria Gonorrhea: NEGATIVE

## 2022-10-24 LAB — OB RESULTS CONSOLE RUBELLA ANTIBODY, IGM: Rubella: NON-IMMUNE/NOT IMMUNE

## 2022-10-24 LAB — OB RESULTS CONSOLE HEPATITIS B SURFACE ANTIGEN: Hepatitis B Surface Ag: NEGATIVE

## 2023-02-19 LAB — OB RESULTS CONSOLE RPR: RPR: NONREACTIVE

## 2023-04-15 LAB — OB RESULTS CONSOLE GBS: GBS: NEGATIVE

## 2023-05-08 ENCOUNTER — Encounter (HOSPITAL_COMMUNITY): Payer: Self-pay

## 2023-05-08 ENCOUNTER — Other Ambulatory Visit: Payer: Self-pay

## 2023-05-08 ENCOUNTER — Inpatient Hospital Stay (HOSPITAL_COMMUNITY)
Admission: AD | Admit: 2023-05-08 | Discharge: 2023-05-11 | DRG: 786 | Disposition: A | Discharge status: Home / Self Care | Payer: No Typology Code available for payment source | Attending: Obstetrics and Gynecology | Admitting: Obstetrics and Gynecology

## 2023-05-08 DIAGNOSIS — O134 Gestational [pregnancy-induced] hypertension without significant proteinuria, complicating childbirth: Secondary | ICD-10-CM | POA: Diagnosis present

## 2023-05-08 DIAGNOSIS — Z3A39 39 weeks gestation of pregnancy: Secondary | ICD-10-CM | POA: Diagnosis not present

## 2023-05-08 DIAGNOSIS — O3663X0 Maternal care for excessive fetal growth, third trimester, not applicable or unspecified: Principal | ICD-10-CM | POA: Diagnosis present

## 2023-05-08 DIAGNOSIS — D509 Iron deficiency anemia, unspecified: Secondary | ICD-10-CM | POA: Diagnosis present

## 2023-05-08 DIAGNOSIS — O411230 Chorioamnionitis, third trimester, not applicable or unspecified: Secondary | ICD-10-CM | POA: Diagnosis present

## 2023-05-08 DIAGNOSIS — O411290 Chorioamnionitis, unspecified trimester, not applicable or unspecified: Secondary | ICD-10-CM | POA: Diagnosis not present

## 2023-05-08 DIAGNOSIS — D62 Acute posthemorrhagic anemia: Secondary | ICD-10-CM | POA: Diagnosis not present

## 2023-05-08 DIAGNOSIS — Z23 Encounter for immunization: Secondary | ICD-10-CM

## 2023-05-08 DIAGNOSIS — O26893 Other specified pregnancy related conditions, third trimester: Secondary | ICD-10-CM | POA: Diagnosis present

## 2023-05-08 DIAGNOSIS — O9081 Anemia of the puerperium: Secondary | ICD-10-CM | POA: Diagnosis not present

## 2023-05-08 DIAGNOSIS — O139 Gestational [pregnancy-induced] hypertension without significant proteinuria, unspecified trimester: Secondary | ICD-10-CM | POA: Diagnosis present

## 2023-05-08 LAB — CBC
HCT: 31.4 % — ABNORMAL LOW (ref 36.0–46.0)
Hemoglobin: 10.5 g/dL — ABNORMAL LOW (ref 12.0–15.0)
MCH: 28.8 pg (ref 26.0–34.0)
MCHC: 33.4 g/dL (ref 30.0–36.0)
MCV: 86 fL (ref 80.0–100.0)
Platelets: 314 10*3/uL (ref 150–400)
RBC: 3.65 MIL/uL — ABNORMAL LOW (ref 3.87–5.11)
RDW: 13.4 % (ref 11.5–15.5)
WBC: 14.1 10*3/uL — ABNORMAL HIGH (ref 4.0–10.5)
nRBC: 0 % (ref 0.0–0.2)

## 2023-05-08 LAB — TYPE AND SCREEN
ABO/RH(D): O POS
Antibody Screen: NEGATIVE

## 2023-05-08 LAB — COMPREHENSIVE METABOLIC PANEL
ALT: 13 U/L (ref 0–44)
AST: 19 U/L (ref 15–41)
Albumin: 2.7 g/dL — ABNORMAL LOW (ref 3.5–5.0)
Alkaline Phosphatase: 97 U/L (ref 38–126)
Anion gap: 12 (ref 5–15)
BUN: 9 mg/dL (ref 6–20)
CO2: 22 mmol/L (ref 22–32)
Calcium: 8.8 mg/dL — ABNORMAL LOW (ref 8.9–10.3)
Chloride: 103 mmol/L (ref 98–111)
Creatinine, Ser: 0.63 mg/dL (ref 0.44–1.00)
GFR, Estimated: >60 mL/min (ref >60)
Glucose, Bld: 84 mg/dL (ref 70–99)
Potassium: 4 mmol/L (ref 3.5–5.1)
Sodium: 137 mmol/L (ref 135–145)
Total Bilirubin: 0.2 mg/dL — ABNORMAL LOW (ref 0.3–1.2)
Total Protein: 6 g/dL — ABNORMAL LOW (ref 6.5–8.1)

## 2023-05-08 MED ORDER — SODIUM CHLORIDE 0.9 % IV SOLN: INTRAVENOUS | Status: DC | PRN

## 2023-05-08 MED ORDER — FENTANYL CITRATE (PF) 100 MCG/2ML IJ SOLN: 50.0000 ug | INTRAMUSCULAR | Status: DC | PRN

## 2023-05-08 MED ORDER — LACTATED RINGERS IV SOLN: 500.0000 mL | INTRAVENOUS | Status: DC | PRN

## 2023-05-08 MED ORDER — OXYTOCIN 10 UNIT/ML IJ SOLN: 10.0000 [IU] | Freq: Once | INTRAMUSCULAR | Status: DC

## 2023-05-08 MED ORDER — OXYTOCIN BOLUS FROM INFUSION
333.0000 mL | Freq: Once | INTRAVENOUS | Status: AC
  Administered 2023-05-09: 333 mL via INTRAVENOUS

## 2023-05-08 MED ORDER — LACTATED RINGERS IV SOLN: INTRAVENOUS | Status: DC

## 2023-05-08 MED ORDER — OXYTOCIN-SODIUM CHLORIDE 30-0.9 UT/500ML-% IV SOLN
1.0000 m[IU]/min | INTRAVENOUS | Status: DC
  Administered 2023-05-08: 2 m[IU]/min via INTRAVENOUS
  Filled 2023-05-08 (×2): qty 500

## 2023-05-08 MED ORDER — LIDOCAINE HCL (PF) 1 % IJ SOLN: 30.0000 mL | INTRAMUSCULAR | Status: DC | PRN

## 2023-05-08 MED ORDER — OXYTOCIN-SODIUM CHLORIDE 30-0.9 UT/500ML-% IV SOLN: 2.5000 [IU]/h | INTRAVENOUS | Status: DC

## 2023-05-08 MED ORDER — ACETAMINOPHEN 500 MG PO TABS: 1000.0000 mg | ORAL_TABLET | Freq: Four times a day (QID) | ORAL | Status: DC | PRN

## 2023-05-08 MED ORDER — SODIUM CHLORIDE 0.9% FLUSH: 3.0000 mL | INTRAVENOUS | Status: DC | PRN

## 2023-05-08 MED ORDER — ONDANSETRON HCL 4 MG/2ML IJ SOLN: 4.0000 mg | Freq: Four times a day (QID) | INTRAMUSCULAR | Status: DC | PRN

## 2023-05-08 MED ORDER — SODIUM CHLORIDE 0.9% FLUSH: 3.0000 mL | Freq: Two times a day (BID) | INTRAVENOUS | Status: DC

## 2023-05-08 MED ORDER — TERBUTALINE SULFATE 1 MG/ML IJ SOLN: 0.2500 mg | Freq: Once | INTRAMUSCULAR | Status: DC | PRN

## 2023-05-08 MED ORDER — SOD CITRATE-CITRIC ACID 500-334 MG/5ML PO SOLN: 30.0000 mL | ORAL | Status: DC | PRN

## 2023-05-08 NOTE — H&P (Addendum)
OB ADMISSION/ HISTORY & PHYSICAL:  Admission Date: 05/08/2023  5:50 PM  Admit Diagnosis: Normal labor [O80, Z37.9]    Sherri Burton is a 27 y.o. female G2P0 at [redacted]w[redacted]d presenting for early labor. Seen for a routine OB visit today and reports contractions started prior to appointment. SVE 5cm and sent for direct admission. Denies leaking of fluid or vaginal bleeding. Endorses + fetal movement. Husband, Cristal Deer, present and supportive. Mother and sister en route. Eagerly anticipating a baby boy "Sherri Burton".   Prenatal History: G2P0   EDC: 05/12/2023 Prenatal care at [facility] since 11 weeks  Primary: Dr. Juliene Pina, transfer to CNMs for waterbirth option in the 3T  Prenatal course complicated by: BMI >40 Suspected LGA, EFW 96% at 36 weeks Rubella non-immune  Prenatal Labs: ABO, Rh:   O POS Antibody: PENDING (09/26 1807) Rubella:   Non-immune RPR:   Non-reactive HBsAg:   Negative HIV:   Negative GBS:   Negative 1 hr Glucola : 100 Genetic Screening: declined Ultrasound: normal XY anatomy, posterior placenta, EFW 96% at 36 weeks    Maternal Diabetes: No Genetic Screening: Declined Maternal Ultrasounds/Referrals: Normal Fetal Ultrasounds or other Referrals:  None Maternal Substance Abuse:  No Significant Maternal Medications:  None Significant Maternal Lab Results:  Group B Strep negative Other Comments:  None  Medical / Surgical History : Past medical history:  Past Medical History:  Diagnosis Date   Medical history non-contributory     Past surgical history:  Past Surgical History:  Procedure Laterality Date   NO PAST SURGERIES     PARATHYROIDECTOMY  11/2021    Family History:  Family History  Problem Relation Age of Onset   Cancer Mother    Healthy Father     Social History:  reports that she has never smoked. She does not have any smokeless tobacco history on file. She reports that she does not currently use alcohol after a past usage of about 1.0  standard drink of alcohol per week. She reports that she does not use drugs.  Allergies: Patient has no known allergies.   Current Medications at time of admission:  Medications Prior to Admission  Medication Sig Dispense Refill Last Dose   Prenatal Vit-Fe Fumarate-FA (PRENATAL VITAMIN) 27-0.8 MG TABS Take 1 tablet by mouth daily. 30 tablet 0 05/07/2023   NEOMYCIN-POLYMYXIN-HYDROCORTISONE (CORTISPORIN) 1 % SOLN OTIC solution Apply 1-2 drops to toe BID after soaking 10 mL 1 More than a month   NUVARING 0.12-0.015 MG/24HR vaginal ring SMARTSIG:1 Ring Vaginal Once a Month   More than a month   oxyCODONE (OXY IR/ROXICODONE) 5 MG immediate release tablet Take by mouth.   More than a month    Review of Systems: Review of Systems  All other systems reviewed and are negative.  Physical Exam: Vital signs and nursing notes reviewed.  Patient Vitals for the past 24 hrs:  BP Temp Temp src Pulse Height Weight  05/08/23 1818 (!) 130/96 99 F (37.2 C) Oral 88 5\' 2"  (1.575 m) 107 kg    General: AAO x 3, NAD Heart: RRR Lungs:CTAB Abdomen: Gravid, NT Extremities: no edema SVE: Dilation: 5 Effacement (%): 70 Station: -2 Presentation: Vertex Exam by:: Dorisann Frames, CNM   AROM of a large amount of clear fluid at 1841.   FHR: 150BPM, moderate variability, + accels, no decels TOCO: Contractions q 2-3 minutes  Labs:   No results for input(s): "WBC", "HGB", "HCT", "PLT" in the last 72 hours.  Assessment/Plan: 27 y.o. G2P0  at [redacted]w[redacted]d, early labor Suspected LGA, EFW 96% at 36 weeks Rubella non-immune  Fetal wellbeing - FHT category 1 EFW LGA 8-9lbs  Labor: AROM on admit, plan expectant management, Pitocin prn  Mild range BP on admit, will add CMP, monitor BPs closely, no waterbirth if mild range BPs persist  GBS negative Rubella non-immune, offer MMR postpartum Rh positive  Pain control: desires unmedicated birth, hydrotherapy but birth on bed for suspected LGA Analgesia/anesthesia  PRN  Anticipated MOD: NSVB  Plans to breastfeed, plans inpatient circumcision. POC discussed with patient and support team, all questions answered.  Dr. Amado Nash notified of admission/plan of care.  [name] CNM, MSN 05/08/2023, 7:11 PM

## 2023-05-09 ENCOUNTER — Encounter (HOSPITAL_COMMUNITY): Payer: Self-pay | Admitting: Obstetrics and Gynecology

## 2023-05-09 ENCOUNTER — Inpatient Hospital Stay (HOSPITAL_COMMUNITY): Payer: No Typology Code available for payment source | Admitting: Anesthesiology

## 2023-05-09 DIAGNOSIS — D509 Iron deficiency anemia, unspecified: Secondary | ICD-10-CM | POA: Diagnosis present

## 2023-05-09 DIAGNOSIS — O139 Gestational [pregnancy-induced] hypertension without significant proteinuria, unspecified trimester: Secondary | ICD-10-CM | POA: Diagnosis present

## 2023-05-09 DIAGNOSIS — O411290 Chorioamnionitis, unspecified trimester, not applicable or unspecified: Secondary | ICD-10-CM | POA: Diagnosis not present

## 2023-05-09 LAB — CBC
HCT: 31.9 % — ABNORMAL LOW (ref 36.0–46.0)
HCT: 24.9 % — ABNORMAL LOW (ref 36.0–46.0)
Hemoglobin: 10.5 g/dL — ABNORMAL LOW (ref 12.0–15.0)
Hemoglobin: 8.2 g/dL — ABNORMAL LOW (ref 12.0–15.0)
MCH: 27.8 pg (ref 26.0–34.0)
MCH: 27.7 pg (ref 26.0–34.0)
MCHC: 32.9 g/dL (ref 30.0–36.0)
MCHC: 32.9 g/dL (ref 30.0–36.0)
MCV: 84.4 fL (ref 80.0–100.0)
MCV: 84.1 fL (ref 80.0–100.0)
Platelets: 301 10*3/uL (ref 150–400)
Platelets: 294 10*3/uL (ref 150–400)
RBC: 3.78 MIL/uL — ABNORMAL LOW (ref 3.87–5.11)
RBC: 2.96 MIL/uL — ABNORMAL LOW (ref 3.87–5.11)
RDW: 13.4 % (ref 11.5–15.5)
RDW: 13.5 % (ref 11.5–15.5)
WBC: 19.7 10*3/uL — ABNORMAL HIGH (ref 4.0–10.5)
WBC: 26.6 10*3/uL — ABNORMAL HIGH (ref 4.0–10.5)
nRBC: 0 % (ref 0.0–0.2)
nRBC: 0 % (ref 0.0–0.2)

## 2023-05-09 LAB — RPR: RPR Ser Ql: NONREACTIVE

## 2023-05-09 MED ORDER — IRON SUCROSE 500 MG IVPB - SIMPLE MED
500.0000 mg | Freq: Once | INTRAVENOUS | Status: AC
  Administered 2023-05-10: 500 mg via INTRAVENOUS
  Filled 2023-05-09: qty 275

## 2023-05-09 MED ORDER — TETANUS-DIPHTH-ACELL PERTUSSIS 5-2.5-18.5 LF-MCG/0.5 IM SUSY
0.5000 mL | PREFILLED_SYRINGE | Freq: Once | INTRAMUSCULAR | Status: DC
  Filled 2023-05-09: qty 0.5

## 2023-05-09 MED ORDER — ACETAMINOPHEN 325 MG PO TABS
650.0000 mg | ORAL_TABLET | ORAL | Status: DC | PRN
  Administered 2023-05-09: 650 mg via ORAL
  Filled 2023-05-09: qty 2

## 2023-05-09 MED ORDER — COCONUT OIL OIL: 1.0000 | TOPICAL_OIL | Status: DC | PRN

## 2023-05-09 MED ORDER — MISOPROSTOL 200 MCG PO TABS
1000.0000 ug | ORAL_TABLET | Freq: Once | ORAL | Status: AC
  Administered 2023-05-09: 1000 ug via RECTAL

## 2023-05-09 MED ORDER — MISOPROSTOL 200 MCG PO TABS
ORAL_TABLET | ORAL | Status: AC
  Filled 2023-05-09: qty 5

## 2023-05-09 MED ORDER — DIBUCAINE (PERIANAL) 1 % EX OINT: 1.0000 | TOPICAL_OINTMENT | CUTANEOUS | Status: DC | PRN

## 2023-05-09 MED ORDER — IBUPROFEN 600 MG PO TABS
600.0000 mg | ORAL_TABLET | Freq: Four times a day (QID) | ORAL | Status: DC
  Administered 2023-05-09 – 2023-05-11 (×8): 600 mg via ORAL
  Filled 2023-05-09 (×8): qty 1

## 2023-05-09 MED ORDER — PHENYLEPHRINE 80 MCG/ML (10ML) SYRINGE FOR IV PUSH (FOR BLOOD PRESSURE SUPPORT)
80.0000 ug | PREFILLED_SYRINGE | INTRAVENOUS | Status: DC | PRN
  Filled 2023-05-09: qty 10

## 2023-05-09 MED ORDER — LACTATED RINGERS IV SOLN
500.0000 mL | Freq: Once | INTRAVENOUS | Status: AC
  Administered 2023-05-09: 500 mL via INTRAVENOUS

## 2023-05-09 MED ORDER — SIMETHICONE 80 MG PO CHEW: 80.0000 mg | CHEWABLE_TABLET | ORAL | Status: DC | PRN

## 2023-05-09 MED ORDER — WITCH HAZEL-GLYCERIN EX PADS: 1.0000 | MEDICATED_PAD | CUTANEOUS | Status: DC | PRN

## 2023-05-09 MED ORDER — ONDANSETRON HCL 4 MG PO TABS: 4.0000 mg | ORAL_TABLET | ORAL | Status: DC | PRN

## 2023-05-09 MED ORDER — SODIUM CHLORIDE 0.9 % IV SOLN
2.0000 g | Freq: Once | INTRAVENOUS | Status: AC
  Administered 2023-05-09: 2 g via INTRAVENOUS
  Filled 2023-05-09: qty 2

## 2023-05-09 MED ORDER — DIPHENHYDRAMINE HCL 50 MG/ML IJ SOLN: 12.5000 mg | INTRAMUSCULAR | Status: DC | PRN

## 2023-05-09 MED ORDER — DIPHENHYDRAMINE HCL 25 MG PO CAPS: 25.0000 mg | ORAL_CAPSULE | Freq: Four times a day (QID) | ORAL | Status: DC | PRN

## 2023-05-09 MED ORDER — LIDOCAINE HCL (PF) 1 % IJ SOLN
INTRAMUSCULAR | Status: DC | PRN
  Administered 2023-05-09: 5 mL via EPIDURAL

## 2023-05-09 MED ORDER — PHENYLEPHRINE 80 MCG/ML (10ML) SYRINGE FOR IV PUSH (FOR BLOOD PRESSURE SUPPORT): 80.0000 ug | PREFILLED_SYRINGE | INTRAVENOUS | Status: DC | PRN

## 2023-05-09 MED ORDER — EPHEDRINE 5 MG/ML INJ: 10.0000 mg | INTRAVENOUS | Status: DC | PRN

## 2023-05-09 MED ORDER — PRENATAL MULTIVITAMIN CH
1.0000 | ORAL_TABLET | Freq: Every day | ORAL | Status: DC
  Administered 2023-05-10 – 2023-05-11 (×2): 1 via ORAL
  Filled 2023-05-09 (×2): qty 1

## 2023-05-09 MED ORDER — ZOLPIDEM TARTRATE 5 MG PO TABS: 5.0000 mg | ORAL_TABLET | Freq: Every evening | ORAL | Status: DC | PRN

## 2023-05-09 MED ORDER — SENNOSIDES-DOCUSATE SODIUM 8.6-50 MG PO TABS
2.0000 | ORAL_TABLET | Freq: Every day | ORAL | Status: DC
  Administered 2023-05-10 – 2023-05-11 (×2): 2 via ORAL
  Filled 2023-05-09 (×2): qty 2

## 2023-05-09 MED ORDER — TRANEXAMIC ACID-NACL 1000-0.7 MG/100ML-% IV SOLN
INTRAVENOUS | Status: AC
  Administered 2023-05-09: 1000 mg
  Filled 2023-05-09: qty 100

## 2023-05-09 MED ORDER — METHYLERGONOVINE MALEATE 0.2 MG/ML IJ SOLN
INTRAMUSCULAR | Status: AC
  Filled 2023-05-09: qty 1

## 2023-05-09 MED ORDER — ONDANSETRON HCL 4 MG/2ML IJ SOLN: 4.0000 mg | INTRAMUSCULAR | Status: DC | PRN

## 2023-05-09 MED ORDER — FENTANYL-BUPIVACAINE-NACL 0.5-0.125-0.9 MG/250ML-% EP SOLN
EPIDURAL | Status: DC | PRN
  Administered 2023-05-09: 12 mL/h via EPIDURAL

## 2023-05-09 MED ORDER — METHYLERGONOVINE MALEATE 0.2 MG/ML IJ SOLN
0.2000 mg | Freq: Once | INTRAMUSCULAR | Status: AC
  Administered 2023-05-09: 0.2 mg via INTRAMUSCULAR

## 2023-05-09 MED ORDER — BENZOCAINE-MENTHOL 20-0.5 % EX AERO
1.0000 | INHALATION_SPRAY | CUTANEOUS | Status: DC | PRN
  Administered 2023-05-09: 1 via TOPICAL
  Filled 2023-05-09: qty 56

## 2023-05-09 MED ORDER — FENTANYL-BUPIVACAINE-NACL 0.5-0.125-0.9 MG/250ML-% EP SOLN
12.0000 mL/h | EPIDURAL | Status: DC | PRN
  Filled 2023-05-09: qty 250

## 2023-05-09 NOTE — Progress Notes (Addendum)
S: Working through contractions. Husband, Cristal Deer, present and supportive.   O: Vitals:   05/08/23 2304 05/08/23 2343 05/09/23 0015 05/09/23 0019  BP: (!) 142/89 (!) 149/95 (!) 158/91 (!) 142/89  Pulse: 77 81 81 82  Temp:      TempSrc:      Weight:      Height:       FHT:  FHR: 130 bpm, variability: moderate,  accelerations:  Present,  decelerations:  Absent UC:   regular, every 2-3 minutes SVE:   Dilation: 6.5 Effacement (%): 80 Station: 0 Exam by:: Dorisann Frames, CNM  A / P: Augmentation of labor, progressing well, Pitocin at 6mu, now ruled in for gestational hypertension  Fetal Wellbeing:  Category I GBS: Negative Pain Control:  Nitrous Oxide Anticipated MOD:  NSVD  BPs mild range, not candidate for waterbirth tub immersion. Labs WNL. Denies PEC symptoms.  Continue nitrous oxide. Patient considering epidural.   [name], CNM, MSN 05/09/2023, 12:35 AM

## 2023-05-09 NOTE — Progress Notes (Signed)
S: Getting comfortable with epidural. Husband, Christopher, and MIL, present and supportive.  O: Vitals:   05/09/23 0250 05/09/23 0255 05/09/23 0300 05/09/23 0305  BP: 133/81 120/69 128/77 133/73  Pulse: 90 (!) 108 99 86  Temp:      TempSrc:      SpO2: 97%   96%  Weight:      Height:       FHT:  FHR: 140 bpm, variability: moderate,  accelerations:  Present,  decelerations:  Absent UC:   regular, every 2-3 minutes SVE:   Dilation: 8.5 Effacement (%): 90 Station: -1 Exam by:: Dorisann Frames, CNM  A / P: Augmentation of labor, progressing well  Fetal Wellbeing:  Category I PEC: Normalizing after epidural placement, watch BPs closely postpartum GBS: Negative Pain Control:  Epidural Anticipated MOD:  NSVD  Allow for laboring down for the next 1-2 hours. Encourage frequent position changes to facilitate fetal rotation and descent.   [name], CNM, MSN 05/09/2023, 3:40 AM

## 2023-05-09 NOTE — Anesthesia Preprocedure Evaluation (Signed)
Anesthesia Evaluation  Patient identified by MRN, date of birth, ID band Patient awake    Reviewed: Allergy & Precautions, NPO status , Patient's Chart, lab work & pertinent test results  Airway Mallampati: II  TM Distance: >3 FB Neck ROM: Full    Dental no notable dental hx. (+) Teeth Intact   Pulmonary neg pulmonary ROS   Pulmonary exam normal breath sounds clear to auscultation       Cardiovascular hypertension (gHtn), Normal cardiovascular exam Rhythm:Regular Rate:Normal     Neuro/Psych negative neurological ROS  negative psych ROS   GI/Hepatic negative GI ROS, Neg liver ROS,,,  Endo/Other  negative endocrine ROS    Renal/GU negative Renal ROS     Musculoskeletal   Abdominal  (+) + obese (BMI 43.2)  Peds  Hematology Lab Results      Component                Value               Date                      WBC                      19.7 (H)            05/09/2023                HGB                      10.5 (L)            05/09/2023                HCT                      31.9 (L)            05/09/2023                MCV                      84.4                05/09/2023                PLT                      301                 05/09/2023              Anesthesia Other Findings   Reproductive/Obstetrics (+) Pregnancy                             Anesthesia Physical Anesthesia Plan  ASA: 3  Anesthesia Plan: Epidural   Post-op Pain Management:    Induction:   PONV Risk Score and Plan:   Airway Management Planned:   Additional Equipment:   Intra-op Plan:   Post-operative Plan:   Informed Consent: I have reviewed the patients History and Physical, chart, labs and discussed the procedure including the risks, benefits and alternatives for the proposed anesthesia with the patient or authorized representative who has indicated his/her understanding and acceptance.       Plan  Discussed with:   Anesthesia Plan Comments: (39.4 wk G2P0 w gHtn 43,2 BMI  for LEA)       Anesthesia Quick Evaluation

## 2023-05-09 NOTE — Lactation Note (Signed)
This note was copied from a baby's chart. Lactation Consultation Note  Patient Name: Sherri Burton [id number] Date: 05/09/2023 Age:27 hours Reason for consult: Initial assessment;1st time breastfeeding;Term;Primapara  Visited P1 parent of term LGA infant. Mom reports she has been trying to feed infant but has not been successful. LC assisted with latch (see LATCH score). Infant was extremely sleepy. Mom has slight areolar edema. [facility] taught mom how to to hand express and do reverse pressure softening before latching infant.  Feeding Plan 1) Do hand expression and reverse pressure softening for a few minutes prior to feeding. 2) Breastfeed baby skin to skin every 2-3 hours, or sooner on demand if baby cues. 3) Call RN/LC for breastfeeding assistance.  Maternal Data Has patient been taught Hand Expression?: Yes Does the patient have breastfeeding experience prior to this delivery?: No  Feeding Mother's Current Feeding Choice: Breast Milk  LATCH Score Latch: Too sleepy or reluctant, no latch achieved, no sucking elicited.  Audible Swallowing: None  Type of Nipple: Flat  Comfort (Breast/Nipple): Soft / non-tender  Hold (Positioning): Assistance needed to correctly position infant at breast and maintain latch.  LATCH Score: 4   Lactation Tools Discussed/Used    Interventions Interventions: Breast feeding basics reviewed;Skin to skin;Assisted with latch;Hand express;Breast compression;Adjust position;Position options;Support pillows;Expressed milk;Education;LC Services brochure  Discharge Pump: DEBP;Manual;Personal  Consult Status Consult Status: Follow-up Date: 05/10/23 Follow-up type: In-patient    Antionette Char 05/09/2023, 5:10 PM

## 2023-05-09 NOTE — Anesthesia Procedure Notes (Signed)
Epidural Patient location during procedure: OB Start time: 05/09/2023 2:13 AM End time: 05/09/2023 2:28 AM  Staffing Anesthesiologist: Trevor Iha, MD Performed: anesthesiologist   Preanesthetic Checklist Completed: patient identified, IV checked, site marked, risks and benefits discussed, surgical consent, monitors and equipment checked, pre-op evaluation and timeout performed  Epidural Patient position: sitting Prep: DuraPrep and site prepped and draped Patient monitoring: continuous pulse ox and blood pressure Approach: midline Location: L3-L4 Injection technique: LOR air  Needle:  Needle type: Tuohy  Needle gauge: 17 G Needle length: 9 cm and 9 Needle insertion depth: 7 cm Catheter type: closed end flexible Catheter size: 19 Gauge Catheter at skin depth: 13 cm Test dose: negative  Assessment Events: blood not aspirated, no cerebrospinal fluid, injection not painful, no injection resistance, no paresthesia and negative IV test  Additional Notes Patient identified. Risks/Benefits/Options discussed with patient including but not limited to bleeding, infection, nerve damage, paralysis, failed block, incomplete pain control, headache, blood pressure changes, nausea, vomiting, reactions to medication both or allergic, itching and postpartum back pain. Confirmed with bedside nurse the patient's most recent platelet count. Confirmed with patient that they are not currently taking any anticoagulation, have any bleeding history or any family history of bleeding disorders. Patient expressed understanding and wished to proceed. All questions were answered. Sterile technique was used throughout the entire procedure. Please see nursing notes for vital signs. Test dose was given through epidural needle and negative prior to continuing to dose epidural or start infusion. Warning signs of high block given to the patient including shortness of breath, tingling/numbness in hands, complete  motor block, or any concerning symptoms with instructions to call for help. Patient was given instructions on fall risk and not to get out of bed. All questions and concerns addressed with instructions to call with any issues.  2 Attempt (S) . Patient tolerated procedure well.

## 2023-05-09 NOTE — Progress Notes (Signed)
S: Pushing well with CNM, spouse, and Engineer, manufacturing. Family at the bedside.   O: Vitals:   05/09/23 0908 05/09/23 0930 05/09/23 1000 05/09/23 1030  BP: 128/74 124/77 119/70 127/69  Pulse: 92 85 88 88  Resp:  16  16  Temp: 99.5 F (37.5 C)     TempSrc: Oral     SpO2:      Weight:      Height:       FHT:  FHR: 140 bpm, variability: moderate,  accelerations:  Present,  decelerations:  Present variable decels UC:   regular, every 2-3 minutes SVE:   Dilation: 10 Effacement (%): 100 Station: +1 Exam by:: Trixy Loyola, CNM  A / P: Augmentation of labor, progressing well  Fetal Wellbeing:  Category I and Category II Pain Control:  Epidural Anticipated MOD:  NSVD  Continue pushing. Prepare room for shoulder dystocia due to suspected LGA.   Dr. Amado Nash updated on patient status and plan of care.   [name], CNM, MSN 05/09/2023, 11:03 AM

## 2023-05-10 ENCOUNTER — Encounter (HOSPITAL_COMMUNITY): Payer: Self-pay | Admitting: Obstetrics and Gynecology

## 2023-05-10 LAB — CBC
HCT: 20.7 % — ABNORMAL LOW (ref 36.0–46.0)
Hemoglobin: 6.8 g/dL — CL (ref 12.0–15.0)
MCH: 27.9 pg (ref 26.0–34.0)
MCHC: 32.9 g/dL (ref 30.0–36.0)
MCV: 84.8 fL (ref 80.0–100.0)
Platelets: 249 10*3/uL (ref 150–400)
RBC: 2.44 MIL/uL — ABNORMAL LOW (ref 3.87–5.11)
RDW: 13.7 % (ref 11.5–15.5)
WBC: 21.9 10*3/uL — ABNORMAL HIGH (ref 4.0–10.5)
nRBC: 0 % (ref 0.0–0.2)

## 2023-05-10 MED ORDER — INFLUENZA VIRUS VACC SPLIT PF (FLUZONE) 0.5 ML IM SUSY
0.5000 mL | PREFILLED_SYRINGE | INTRAMUSCULAR | Status: AC
  Administered 2023-05-11: 0.5 mL via INTRAMUSCULAR
  Filled 2023-05-10: qty 0.5

## 2023-05-10 NOTE — Anesthesia Postprocedure Evaluation (Signed)
Anesthesia Post Note  Patient: Sherri Burton  Procedure(s) Performed: AN AD HOC LABOR EPIDURAL     Patient location during evaluation: Mother Baby Anesthesia Type: Epidural Level of consciousness: awake and alert and oriented Pain management: satisfactory to patient Vital Signs Assessment: post-procedure vital signs reviewed and stable Respiratory status: respiratory function stable Cardiovascular status: stable Postop Assessment: no headache, no backache, epidural receding, patient able to bend at knees, no signs of nausea or vomiting, adequate PO intake and able to ambulate Anesthetic complications: no   No notable events documented.  Last Vitals:  Vitals:   05/10/23 0141 05/10/23 0527  BP: 119/70 112/72  Pulse: 91 75  Resp: 16 16  Temp: 36.8 C 36.7 C  SpO2: 98% 99%    Last Pain:  Vitals:   05/10/23 0859  TempSrc:   PainSc: 0-No pain   Pain Goal:                   Sherri Burton

## 2023-05-10 NOTE — Lactation Note (Signed)
This note was copied from a baby's chart. Lactation Consultation Note  Patient Name: Sherri Burton [id number] Date: 05/10/2023 Age:27 hours Reason for consult: Follow-up assessment;Mother's request;Difficult latch;Primapara;1st time breastfeeding;Term;Breastfeeding assistance;RN request  P1- RN asked LC to assist MOB with feeding infant. According to MOB, she has not been able to get infant to latch and nurse at all since birth. MOB also stated that infant was really spitty. Infant had a wet and dirty diaper, so FOB changed it. LC then assisted MOB with placing infant to the left breast in the football hold. Infant would not latch dispite stimulation. Infant would clench his jaw.  LC encouraged MOB to hand express. LC and MOB were able to collect 5 thick drops of colostrum within 10 minutes, and spoon fed it to infant. Infant seemed to perk up a little. LC encouraged MOB to try to latch with her 24 mm nipple shield to assist in stimulation. Infant was able to latch with the shield and nursed for 8 minutes, but he needed frequent stimulation to continue. LC encouraged MOB to always attempt a latch without the shield first.  LC reviewed feeding infant on cue 8-12x in 24 hrs and not allowing infant to go over 3 hrs without a feeding.  Maternal Data Has patient been taught Hand Expression?: Yes Does the patient have breastfeeding experience prior to this delivery?: No  Feeding Mother's Current Feeding Choice: Breast Milk  LATCH Score Latch: Repeated attempts needed to sustain latch, nipple held in mouth throughout feeding, stimulation needed to elicit sucking reflex.  Audible Swallowing: A few with stimulation  Type of Nipple: Everted at rest and after stimulation  Comfort (Breast/Nipple): Soft / non-tender  Hold (Positioning): Full assist, staff holds infant at breast  LATCH Score: 6   Lactation Tools Discussed/Used Tools: Nipple Shields Nipple shield size: 24 Pump Education:  Milk Storage  Interventions Interventions: Breast feeding basics reviewed;Assisted with latch;Breast massage;Hand express;Breast compression;Adjust position;Support pillows;Position options;Expressed milk;Education;LC Services brochure  Discharge Discharge Education: Warning signs for feeding baby  Consult Status Consult Status: Follow-up Date: 05/11/23 Follow-up type: In-patient    Dema Severin BS, IBCLC 05/10/2023, 9:58 AM

## 2023-05-10 NOTE — Progress Notes (Addendum)
Post Partum Day 1. VAVD, 2nd degree tear.  Boy, breast feeding Labor AOL for GTHN/ borderline BPs. Long labor, long 2nd stage. Vaginal tear bleeding during pushing and lost 600 cc before delivery and 1000 cc after w/ atony and clots in uterus. S/p TXA, one Methergine IM and rectal cytotec. Bleeding has been minimal overnight  Subjective: Feels well, some perineal pain, well controlled. Vag bleeding is minimal. Ambulating,  No dizziness/ SOB/ CP fatigue/ fainting, Is asymptomatic from acute blood loss anemia.   Objective: Blood pressure 112/72, pulse 75, temperature 98.1 F (36.7 C), temperature source Oral, resp. rate 16, height 5\' 2"  (1.575 m), weight 107 kg, SpO2 99%, unknown if currently breastfeeding. Patient Vitals for the past 24 hrs:  BP Temp Temp src Pulse Resp SpO2  05/10/23 0527 112/72 98.1 F (36.7 C) Oral 75 16 99 %  05/10/23 0141 119/70 98.2 F (36.8 C) Oral 91 16 98 %  05/09/23 2115 114/68 97.9 F (36.6 C) Oral 98 18 99 %  05/09/23 1735 122/76 99.3 F (37.4 C) Oral (!) 102 -- --  05/09/23 1629 137/77 99.9 F (37.7 C) Oral (!) 114 18 96 %  05/09/23 1530 131/71 -- -- (!) 101 -- --  05/09/23 1501 (!) 132/99 -- -- (!) 140 -- --  05/09/23 1446 (!) 112/56 -- -- (!) 102 -- --  05/09/23 1432 107/75 -- -- (!) 106 -- --  05/09/23 1416 (!) 119/100 -- -- (!) 150 16 --  05/09/23 1355 -- 100 F (37.8 C) Oral -- -- --    Physical Exam:  General: alert and cooperative CV RRR  Lungs CTA  Lochia: appropriate Uterine Fundus: firm at U  Incision: healing well DVT Evaluation: No evidence of DVT seen on physical exam. Negative Homan's sign.     Latest Ref Rng & Units 05/10/2023    5:42 AM 05/09/2023    8:08 PM 05/09/2023    1:38 AM  CBC  WBC 4.0 - 10.5 K/uL 21.9  26.6  19.7   Hemoglobin 12.0 - 15.0 g/dL 6.8  8.2  30.8   Hematocrit 36.0 - 46.0 % 20.7  24.9  31.9   Platelets 150 - 400 K/uL 249  294  301     O+ Rub Non Immune   Assessment/Plan: 27 yo G1P1, PPD #1 VAVD after  long labor. Postpartum hemorrhage- pre delivery and post delivery- stable at present. Plan IV Venofer today and repeat CBC tomorrow and see. Assess symptoms today  PP care routine, LC to assist Rub NI- MMR before discharge  Great support  Boy- circ tomorrow    LOS: 2 days   Robley Fries, MD 05/10/2023, 1:30 PM

## 2023-05-10 NOTE — Plan of Care (Signed)
  Problem: Education: Goal: Knowledge of Childbirth will improve Outcome: Progressing Goal: Ability to make informed decisions regarding treatment and plan of care will improve Outcome: Progressing Goal: Ability to state and carry out methods to decrease the pain will improve Outcome: Progressing Goal: Individualized Educational Video(s) Outcome: Progressing   Problem: Coping: Goal: Ability to verbalize concerns and feelings about labor and delivery will improve Outcome: Progressing   Problem: Life Cycle: Goal: Ability to make normal progression through stages of labor will improve Outcome: Progressing Goal: Ability to effectively push during vaginal delivery will improve Outcome: Progressing   Problem: Role Relationship: Goal: Will demonstrate positive interactions with the child Outcome: Progressing   Problem: Safety: Goal: Risk of complications during labor and delivery will decrease Outcome: Progressing   Problem: Pain Management: Goal: Relief or control of pain from uterine contractions will improve Outcome: Progressing   Problem: Education: Goal: Knowledge of General Education information will improve Description: Including pain rating scale, medication(s)/side effects and non-pharmacologic comfort measures Outcome: Progressing   Problem: Health Behavior/Discharge Planning: Goal: Ability to manage health-related needs will improve Outcome: Progressing   Problem: Clinical Measurements: Goal: Ability to maintain clinical measurements within normal limits will improve Outcome: Progressing Goal: Will remain free from infection Outcome: Progressing Goal: Diagnostic test results will improve Outcome: Progressing Goal: Respiratory complications will improve Outcome: Progressing Goal: Cardiovascular complication will be avoided Outcome: Progressing   Problem: Activity: Goal: Risk for activity intolerance will decrease Outcome: Progressing   Problem:  Nutrition: Goal: Adequate nutrition will be maintained Outcome: Progressing   Problem: Coping: Goal: Level of anxiety will decrease Outcome: Progressing   Problem: Elimination: Goal: Will not experience complications related to bowel motility Outcome: Progressing Goal: Will not experience complications related to urinary retention Outcome: Progressing   Problem: Pain Managment: Goal: General experience of comfort will improve Outcome: Progressing   Problem: Safety: Goal: Ability to remain free from injury will improve Outcome: Progressing   Problem: Skin Integrity: Goal: Risk for impaired skin integrity will decrease Outcome: Progressing   Problem: Education: Goal: Knowledge of condition will improve Outcome: Progressing Goal: Individualized Educational Video(s) Outcome: Progressing Goal: Individualized Newborn Educational Video(s) Outcome: Progressing   Problem: Activity: Goal: Will verbalize the importance of balancing activity with adequate rest periods Outcome: Progressing Goal: Ability to tolerate increased activity will improve Outcome: Progressing   Problem: Coping: Goal: Ability to identify and utilize available resources and services will improve Outcome: Progressing   Problem: Life Cycle: Goal: Chance of risk for complications during the postpartum period will decrease Outcome: Progressing   Problem: Role Relationship: Goal: Ability to demonstrate positive interaction with newborn will improve Outcome: Progressing   Problem: Skin Integrity: Goal: Demonstration of wound healing without infection will improve Outcome: Progressing   Problem: Education: Goal: Knowledge of condition will improve Outcome: Progressing Goal: Individualized Educational Video(s) Outcome: Progressing Goal: Individualized Newborn Educational Video(s) Outcome: Progressing   Problem: Activity: Goal: Will verbalize the importance of balancing activity with adequate rest  periods Outcome: Progressing Goal: Ability to tolerate increased activity will improve Outcome: Progressing   Problem: Coping: Goal: Ability to identify and utilize available resources and services will improve Outcome: Progressing   Problem: Life Cycle: Goal: Chance of risk for complications during the postpartum period will decrease Outcome: Progressing   Problem: Role Relationship: Goal: Ability to demonstrate positive interaction with newborn will improve Outcome: Progressing   Problem: Skin Integrity: Goal: Demonstration of wound healing without infection will improve Outcome: Progressing   

## 2023-05-11 LAB — CBC
HCT: 22.5 % — ABNORMAL LOW (ref 36.0–46.0)
Hemoglobin: 7.4 g/dL — ABNORMAL LOW (ref 12.0–15.0)
MCH: 28.8 pg (ref 26.0–34.0)
MCHC: 32.9 g/dL (ref 30.0–36.0)
MCV: 87.5 fL (ref 80.0–100.0)
Platelets: 293 10*3/uL (ref 150–400)
RBC: 2.57 MIL/uL — ABNORMAL LOW (ref 3.87–5.11)
RDW: 13.8 % (ref 11.5–15.5)
WBC: 16.5 10*3/uL — ABNORMAL HIGH (ref 4.0–10.5)
nRBC: 0 % (ref 0.0–0.2)

## 2023-05-11 MED ORDER — ACETAMINOPHEN 325 MG PO TABS: 650.0000 mg | ORAL_TABLET | ORAL | Status: AC | PRN

## 2023-05-11 MED ORDER — IBUPROFEN 600 MG PO TABS: 600.0000 mg | ORAL_TABLET | Freq: Four times a day (QID) | ORAL | 0 refills | Status: AC

## 2023-05-11 MED ORDER — SENNOSIDES-DOCUSATE SODIUM 8.6-50 MG PO TABS: 2.0000 | ORAL_TABLET | Freq: Every day | ORAL | Status: AC

## 2023-05-11 NOTE — Plan of Care (Signed)
Problem: Education: Goal: Knowledge of Childbirth will improve 05/11/2023 1151 by Donne Hazel, LPN Outcome: Adequate for Discharge 05/11/2023 1191 by Donne Hazel, LPN Outcome: Progressing Goal: Ability to make informed decisions regarding treatment and plan of care will improve 05/11/2023 1151 by Donne Hazel, LPN Outcome: Adequate for Discharge 05/11/2023 4782 by Donne Hazel, LPN Outcome: Progressing Goal: Ability to state and carry out methods to decrease the pain will improve 05/11/2023 1151 by Donne Hazel, LPN Outcome: Adequate for Discharge 05/11/2023 9562 by Donne Hazel, LPN Outcome: Progressing Goal: Individualized Educational Video(s) 05/11/2023 1151 by Donne Hazel, LPN Outcome: Adequate for Discharge 05/11/2023 0728 by Donne Hazel, LPN Outcome: Progressing   Problem: Coping: Goal: Ability to verbalize concerns and feelings about labor and delivery will improve 05/11/2023 1151 by Donne Hazel, LPN Outcome: Adequate for Discharge 05/11/2023 0728 by Donne Hazel, LPN Outcome: Progressing   Problem: Life Cycle: Goal: Ability to make normal progression through stages of labor will improve 05/11/2023 1151 by Donne Hazel, LPN Outcome: Adequate for Discharge 05/11/2023 1308 by Donne Hazel, LPN Outcome: Progressing Goal: Ability to effectively push during vaginal delivery will improve 05/11/2023 1151 by Donne Hazel, LPN Outcome: Adequate for Discharge 05/11/2023 0728 by Donne Hazel, LPN Outcome: Progressing   Problem: Role Relationship: Goal: Will demonstrate positive interactions with the child 05/11/2023 1151 by Donne Hazel, LPN Outcome: Adequate for Discharge 05/11/2023 6578 by Donne Hazel, LPN Outcome: Progressing   Problem: Safety: Goal: Risk of complications during labor and delivery will decrease 05/11/2023 1151 by Donne Hazel, LPN Outcome: Adequate for Discharge 05/11/2023 4696 by Donne Hazel, LPN Outcome: Progressing    Problem: Pain Management: Goal: Relief or control of pain from uterine contractions will improve 05/11/2023 1151 by Donne Hazel, LPN Outcome: Adequate for Discharge 05/11/2023 2952 by Donne Hazel, LPN Outcome: Progressing   Problem: Education: Goal: Knowledge of General Education information will improve Description: Including pain rating scale, medication(s)/side effects and non-pharmacologic comfort measures 05/11/2023 1151 by Donne Hazel, LPN Outcome: Adequate for Discharge 05/11/2023 8413 by Donne Hazel, LPN Outcome: Progressing   Problem: Health Behavior/Discharge Planning: Goal: Ability to manage health-related needs will improve 05/11/2023 1151 by Donne Hazel, LPN Outcome: Adequate for Discharge 05/11/2023 2440 by Donne Hazel, LPN Outcome: Progressing   Problem: Clinical Measurements: Goal: Ability to maintain clinical measurements within normal limits will improve 05/11/2023 1151 by Donne Hazel, LPN Outcome: Adequate for Discharge 05/11/2023 0728 by Donne Hazel, LPN Outcome: Progressing Goal: Will remain free from infection 05/11/2023 1151 by Donne Hazel, LPN Outcome: Adequate for Discharge 05/11/2023 0728 by Donne Hazel, LPN Outcome: Progressing Goal: Diagnostic test results will improve 05/11/2023 1151 by Donne Hazel, LPN Outcome: Adequate for Discharge 05/11/2023 1027 by Donne Hazel, LPN Outcome: Progressing Goal: Respiratory complications will improve 05/11/2023 1151 by Donne Hazel, LPN Outcome: Adequate for Discharge 05/11/2023 2536 by Donne Hazel, LPN Outcome: Progressing Goal: Cardiovascular complication will be avoided 05/11/2023 1151 by Donne Hazel, LPN Outcome: Adequate for Discharge 05/11/2023 6440 by Donne Hazel, LPN Outcome: Progressing   Problem: Activity: Goal: Risk for activity intolerance will decrease 05/11/2023 1151 by Donne Hazel, LPN Outcome: Adequate for Discharge 05/11/2023 3474 by Donne Hazel,  LPN Outcome: Progressing   Problem: Nutrition: Goal: Adequate nutrition will be maintained 05/11/2023 1151 by Donne Hazel, LPN Outcome: Adequate for Discharge 05/11/2023 0728 by  Adam Phenix A, LPN Outcome: Progressing   Problem: Coping: Goal: Level of anxiety will decrease 05/11/2023 1151 by Donne Hazel, LPN Outcome: Adequate for Discharge 05/11/2023 4782 by Donne Hazel, LPN Outcome: Progressing   Problem: Elimination: Goal: Will not experience complications related to bowel motility 05/11/2023 1151 by Donne Hazel, LPN Outcome: Adequate for Discharge 05/11/2023 0728 by Donne Hazel, LPN Outcome: Progressing Goal: Will not experience complications related to urinary retention 05/11/2023 1151 by Donne Hazel, LPN Outcome: Adequate for Discharge 05/11/2023 0728 by Donne Hazel, LPN Outcome: Progressing   Problem: Pain Managment: Goal: General experience of comfort will improve 05/11/2023 1151 by Donne Hazel, LPN Outcome: Adequate for Discharge 05/11/2023 9562 by Donne Hazel, LPN Outcome: Progressing   Problem: Safety: Goal: Ability to remain free from injury will improve 05/11/2023 1151 by Donne Hazel, LPN Outcome: Adequate for Discharge 05/11/2023 1308 by Donne Hazel, LPN Outcome: Progressing   Problem: Skin Integrity: Goal: Risk for impaired skin integrity will decrease 05/11/2023 1151 by Donne Hazel, LPN Outcome: Adequate for Discharge 05/11/2023 6578 by Donne Hazel, LPN Outcome: Progressing   Problem: Education: Goal: Knowledge of condition will improve 05/11/2023 1151 by Donne Hazel, LPN Outcome: Adequate for Discharge 05/11/2023 4696 by Donne Hazel, LPN Outcome: Progressing Goal: Individualized Educational Video(s) 05/11/2023 1151 by Donne Hazel, LPN Outcome: Adequate for Discharge 05/11/2023 2952 by Donne Hazel, LPN Outcome: Progressing Goal: Individualized Newborn Educational Video(s) 05/11/2023 1151 by Donne Hazel,  LPN Outcome: Adequate for Discharge 05/11/2023 0728 by Donne Hazel, LPN Outcome: Progressing   Problem: Activity: Goal: Will verbalize the importance of balancing activity with adequate rest periods 05/11/2023 1151 by Donne Hazel, LPN Outcome: Adequate for Discharge 05/11/2023 8413 by Donne Hazel, LPN Outcome: Progressing Goal: Ability to tolerate increased activity will improve 05/11/2023 1151 by Donne Hazel, LPN Outcome: Adequate for Discharge 05/11/2023 0728 by Donne Hazel, LPN Outcome: Progressing   Problem: Coping: Goal: Ability to identify and utilize available resources and services will improve 05/11/2023 1151 by Donne Hazel, LPN Outcome: Adequate for Discharge 05/11/2023 2440 by Donne Hazel, LPN Outcome: Progressing   Problem: Life Cycle: Goal: Chance of risk for complications during the postpartum period will decrease 05/11/2023 1151 by Donne Hazel, LPN Outcome: Adequate for Discharge 05/11/2023 1027 by Donne Hazel, LPN Outcome: Progressing   Problem: Role Relationship: Goal: Ability to demonstrate positive interaction with newborn will improve 05/11/2023 1151 by Donne Hazel, LPN Outcome: Adequate for Discharge 05/11/2023 2536 by Donne Hazel, LPN Outcome: Progressing   Problem: Skin Integrity: Goal: Demonstration of wound healing without infection will improve 05/11/2023 1151 by Donne Hazel, LPN Outcome: Adequate for Discharge 05/11/2023 6440 by Donne Hazel, LPN Outcome: Progressing   Problem: Education: Goal: Knowledge of condition will improve 05/11/2023 1151 by Donne Hazel, LPN Outcome: Adequate for Discharge 05/11/2023 3474 by Donne Hazel, LPN Outcome: Progressing Goal: Individualized Educational Video(s) 05/11/2023 1151 by Donne Hazel, LPN Outcome: Adequate for Discharge 05/11/2023 0728 by Donne Hazel, LPN Outcome: Progressing Goal: Individualized Newborn Educational Video(s) 05/11/2023 1151 by Donne Hazel,  LPN Outcome: Adequate for Discharge 05/11/2023 0728 by Donne Hazel, LPN Outcome: Progressing   Problem: Activity: Goal: Will verbalize the importance of balancing activity with adequate rest periods 05/11/2023 1151 by Donne Hazel, LPN Outcome: Adequate for Discharge 05/11/2023 2595 by Donne Hazel, LPN Outcome: Progressing Goal: Ability  to tolerate increased activity will improve 05/11/2023 1151 by Donne Hazel, LPN Outcome: Adequate for Discharge 05/11/2023 2952 by Donne Hazel, LPN Outcome: Progressing   Problem: Coping: Goal: Ability to identify and utilize available resources and services will improve 05/11/2023 1151 by Donne Hazel, LPN Outcome: Adequate for Discharge 05/11/2023 8413 by Donne Hazel, LPN Outcome: Progressing   Problem: Life Cycle: Goal: Chance of risk for complications during the postpartum period will decrease 05/11/2023 1151 by Donne Hazel, LPN Outcome: Adequate for Discharge 05/11/2023 2440 by Donne Hazel, LPN Outcome: Progressing   Problem: Role Relationship: Goal: Ability to demonstrate positive interaction with newborn will improve 05/11/2023 1151 by Donne Hazel, LPN Outcome: Adequate for Discharge 05/11/2023 1027 by Donne Hazel, LPN Outcome: Progressing   Problem: Skin Integrity: Goal: Demonstration of wound healing without infection will improve 05/11/2023 1151 by Donne Hazel, LPN Outcome: Adequate for Discharge 05/11/2023 2536 by Donne Hazel, LPN Outcome: Progressing

## 2023-05-11 NOTE — Lactation Note (Signed)
This note was copied from a baby's chart. Lactation Consultation Note  Patient Name: Sherri Burton [id number] Date: 05/11/2023 Age:27 hours Reason for consult: Follow-up assessment;1st time breastfeeding  P1, Baby sleeping after circumcision. Mother tearful. Reassured mother.   Discussed feeding on demand, pumping if baby continues to have short feedings and is sleepy at the breast or using NS. Mother pumped 5 ml earlier.  Praised her for her efforts. Reviewed engorgement care and monitoring voids/stools.  Maternal Data Has patient been taught Hand Expression?: Yes Does the patient have breastfeeding experience prior to this delivery?: No  Feeding Mother's Current Feeding Choice: Breast Milk  Lactation Tools Discussed/Used Tools: Pump;Nipple Shields Nipple shield size: 24 Breast pump type: Double-Electric Breast Pump Pump Education: Milk Storage Reason for Pumping: stimulation and supplementation Pumping frequency: PRN Pumped volume: 5 mL  Interventions Interventions: Education;DEBP  Discharge Discharge Education: Engorgement and breast care;Warning signs for feeding baby Pump: Personal;DEBP  Consult Status Consult Status: Complete Date: 05/11/23    Dahlia Byes [facility] 05/11/2023, 11:33 AM

## 2023-05-11 NOTE — Discharge Summary (Signed)
Postpartum Discharge Summary     Patient Name: Sherri Burton DOB: 12/07/1995 MRN: [id number]  Date of admission: 05/08/2023 Delivery date:05/09/2023 Delivering provider: Valeska Haislip Date of discharge: 05/11/2023  Admitting diagnosis: Normal labor [O80, Z37.9] Intrauterine pregnancy: [redacted]w[redacted]d     Secondary diagnosis:  Principal Problem:   Postpartum care following vaginal delivery 9/27 Active Problems:   Normal labor   Gestational hypertension   Iron deficiency anemia during pregnancy   Vacuum-assisted vaginal delivery   PPH (postpartum hemorrhage)   Chorioamnionitis   Second degree perineal laceration   Iron deficiency anemia  Additional problems: Acute blood loss anemia complicated by pregnancy anemia     Discharge diagnosis: Term Pregnancy Delivered  - Vacuum assisted vaginal delivery                                      Post partum procedures: IV Venofer  Augmentation: AROM and Pitocin Complications: Hemorrhage>1040mL  Hospital course: Onset of Labor With Vaginal Delivery      27 y.o. yo G2P1001 at [redacted]w[redacted]d was admitted in Active Labor on 05/08/2023. Labor course was complicated by long active phase and long second stage.   Membrane Rupture Time/Date: 6:41 PM,05/08/2023  Delivery Method: Vacuum assisted vaginal delivery Operative Delivery:Device used:Kiwi  Indication: Prolonged second stage Episiotomy: None Lacerations:  2nd dgr perineal at birth and vaginal by CNM doing perineal massage    Patient had a postpartum course complicated by post delivery anemia but normal orthostatics and stable H/H, so blooo transfusion was deferred and she did well with IV Venofer.  She is ambulating, tolerating a regular diet, passing flatus, and urinating well. Patient is discharged home in stable condition on 05/11/23.  Newborn Data: Birth date:05/09/2023 Birth time:1:46 PM Gender:Female Living status:Living Apgars:9 ,9  Weight:4110 g  Magnesium Sulfate received: NA BMZ received:  NA Rhophylac:N/A MMR:No T-DaP:Given prenatally Flu: No RSV Vaccine received: No Transfusion:No Immunizations administered: There is no immunization history for the selected administration types on file for this patient.  Physical exam  Vitals:   05/10/23 0527 05/10/23 1504 05/10/23 2100 05/11/23 0530  BP: 112/72 108/69 108/61 112/69  Pulse: 75 86 81 80  Resp: 16 16 16 17   Temp: 98.1 F (36.7 C) 98.1 F (36.7 C) 97.9 F (36.6 C) 98 F (36.7 C)  TempSrc: Oral Oral Oral Oral  SpO2: 99% 100% 99% 99%  Weight:      Height:       General: alert, cooperative, and no distress Lochia: appropriate Uterine Fundus: firm Incision: Healing well with no significant drainage DVT Evaluation: Negative Homan's sign. Labs: Lab Results  Component Value Date   WBC 16.5 (H) 05/11/2023   HGB 7.4 (L) 05/11/2023   HCT 22.5 (L) 05/11/2023   MCV 87.5 05/11/2023   PLT 293 05/11/2023      Latest Ref Rng & Units 05/08/2023    8:06 PM  CMP  Glucose 70 - 99 mg/dL 84   BUN 6 - 20 mg/dL 9   Creatinine 1.32 - 4.40 mg/dL 1.02   Sodium 725 - 366 mmol/L 137   Potassium 3.5 - 5.1 mmol/L 4.0   Chloride 98 - 111 mmol/L 103   CO2 22 - 32 mmol/L 22   Calcium 8.9 - 10.3 mg/dL 8.8   Total Protein 6.5 - 8.1 g/dL 6.0   Total Bilirubin 0.3 - 1.2 mg/dL 0.2   Alkaline Phos 38 -  126 U/L 97   AST 15 - 41 U/L 19   ALT 0 - 44 U/L 13    Edinburgh Score:    05/09/2023    4:30 PM  Edinburgh Postnatal Depression Scale Screening Tool  I have been able to laugh and see the funny side of things. 0  I have looked forward with enjoyment to things. 0  I have blamed myself unnecessarily when things went wrong. 1  I have been anxious or worried for no good reason. 0  I have felt scared or panicky for no good reason. 0  Things have been getting on top of me. 0  I have been so unhappy that I have had difficulty sleeping. 0  I have felt sad or miserable. 0  I have been so unhappy that I have been crying. 0  The  thought of harming myself has occurred to me. 0  Edinburgh Postnatal Depression Scale Total 1      After visit meds:  Allergies as of 05/11/2023   No Known Allergies      Medication List     TAKE these medications    acetaminophen 325 MG tablet Commonly known as: Tylenol Take 2 tablets (650 mg total) by mouth every 4 (four) hours as needed (for pain scale < 4).   ibuprofen 600 MG tablet Commonly known as: ADVIL Take 1 tablet (600 mg total) by mouth every 6 (six) hours.   PRENATAL PO Take 1 tablet by mouth daily.   senna-docusate 8.6-50 MG tablet Commonly known as: Senokot-S Take 2 tablets by mouth daily.         Discharge home in stable condition Infant Feeding: Breast Infant Disposition:home with mother Discharge instruction: per After Visit Summary and Postpartum booklet. Activity: Advance as tolerated. Pelvic rest for 6 weeks.  Diet: routine diet Anticipated Birth Control: Unsure Postpartum Appointment:6 weeks Additional Postpartum F/U: as needed  Future Appointments:No future appointments. Follow up Visit:  Follow-up Information     Shea Evans, MD Follow up in 6 week(s).   Specialty: Obstetrics and Gynecology Contact information: [address] [place] [address] [phone]                  05/11/2023 Robley Fries, MD

## 2023-05-11 NOTE — Progress Notes (Signed)
Post Partum Day 2. VAVD, 2nd degree tear.  Boy, breast feeding Labor AOL for GTHN/ borderline BPs. Long labor, long 2nd stage. Vaginal tear bleeding during pushing and lost 600 cc before delivery and 1000 cc after w/ atony and clots in uterus. S/p TXA, one Methergine IM and rectal cytotec. Bleeding has been minimal overnight  Subjective: Feels well, some perineal pain, well controlled. Vag bleeding is minimal. Ambulating,  No dizziness/ SOB/ CP fatigue/ fainting, Is asymptomatic from acute blood loss anemia.   Objective: Blood pressure 112/69, pulse 80, temperature 98 F (36.7 C), temperature source Oral, resp. rate 17, height 5\' 2"  (1.575 m), weight 107 kg, SpO2 99%, unknown if currently breastfeeding. Patient Vitals for the past 24 hrs:  BP Temp Temp src Pulse Resp SpO2  05/11/23 0530 112/69 98 F (36.7 C) Oral 80 17 99 %  05/10/23 2100 108/61 97.9 F (36.6 C) Oral 81 16 99 %  05/10/23 1504 108/69 98.1 F (36.7 C) Oral 86 16 100 %    Physical Exam:  General: alert and cooperative CV RRR  Lungs CTA  Lochia: appropriate Uterine Fundus: firm at U  Incision: healing well DVT Evaluation: No evidence of DVT seen on physical exam. Negative Homan's sign.     Latest Ref Rng & Units 05/11/2023    4:59 AM 05/10/2023    5:42 AM 05/09/2023    8:08 PM  CBC  WBC 4.0 - 10.5 K/uL 16.5  21.9  26.6   Hemoglobin 12.0 - 15.0 g/dL 7.4  6.8  8.2   Hematocrit 36.0 - 46.0 % 22.5  20.7  24.9   Platelets 150 - 400 K/uL 293  249  294     O+ Rub Non Immune   Assessment/Plan: 27 yo G1P1, PPD #2 VAVD after long labor. Postpartum hemorrhage- pre delivery and post delivery- stable at present. S/p IV Venofer 9/28, CBC stable better today, pt asymptomatic, defer blood transfusion.   Borderline BPs at admission, no GHTN Hx.,nl BPs since delivery, no concerns. Warning ss dw pt  Boy s/p circ Discharge today  F/up in 6 wks  PP care and PEC ss dw pt    LOS: 3 days   Robley Fries, MD 05/11/2023, 11:29  AM

## 2023-05-11 NOTE — Discharge Instructions (Signed)
Sleep on back on empty crib Sponge bath until cord falls off Feeds every 2-3 hours or as baby shows signs of hunger  THINGS TO WATCH FOR: Fever 100.4 or above  Respiratory symptoms: sneezing can be normal, coughing is not Infection of umbilical cord and circumcision site if baby was circumcised.   Congratulations!! 

## 2023-05-11 NOTE — Plan of Care (Signed)
Problem: Education: Goal: Knowledge of Childbirth will improve 05/11/2023 0728 by Donne Hazel, LPN Outcome: Progressing 05/11/2023 0728 by Donne Hazel, LPN Outcome: Progressing Goal: Ability to make informed decisions regarding treatment and plan of care will improve 05/11/2023 0728 by Donne Hazel, LPN Outcome: Progressing 05/11/2023 0728 by Donne Hazel, LPN Outcome: Progressing Goal: Ability to state and carry out methods to decrease the pain will improve 05/11/2023 0728 by Donne Hazel, LPN Outcome: Progressing 05/11/2023 0728 by Donne Hazel, LPN Outcome: Progressing Goal: Individualized Educational Video(s) 05/11/2023 0728 by Donne Hazel, LPN Outcome: Progressing 05/11/2023 0728 by Donne Hazel, LPN Outcome: Progressing   Problem: Coping: Goal: Ability to verbalize concerns and feelings about labor and delivery will improve 05/11/2023 0728 by Donne Hazel, LPN Outcome: Progressing 05/11/2023 0728 by Donne Hazel, LPN Outcome: Progressing   Problem: Life Cycle: Goal: Ability to make normal progression through stages of labor will improve 05/11/2023 0728 by Donne Hazel, LPN Outcome: Progressing 05/11/2023 0728 by Donne Hazel, LPN Outcome: Progressing Goal: Ability to effectively push during vaginal delivery will improve 05/11/2023 0728 by Donne Hazel, LPN Outcome: Progressing 05/11/2023 0728 by Donne Hazel, LPN Outcome: Progressing   Problem: Role Relationship: Goal: Will demonstrate positive interactions with the child 05/11/2023 0728 by Donne Hazel, LPN Outcome: Progressing 05/11/2023 0728 by Donne Hazel, LPN Outcome: Progressing   Problem: Safety: Goal: Risk of complications during labor and delivery will decrease 05/11/2023 0728 by Donne Hazel, LPN Outcome: Progressing 05/11/2023 0728 by Donne Hazel, LPN Outcome: Progressing   Problem: Pain Management: Goal: Relief or control of pain from uterine contractions will  improve 05/11/2023 0728 by Donne Hazel, LPN Outcome: Progressing 05/11/2023 0728 by Donne Hazel, LPN Outcome: Progressing   Problem: Education: Goal: Knowledge of General Education information will improve Description: Including pain rating scale, medication(s)/side effects and non-pharmacologic comfort measures 05/11/2023 0728 by Donne Hazel, LPN Outcome: Progressing 05/11/2023 0728 by Donne Hazel, LPN Outcome: Progressing   Problem: Health Behavior/Discharge Planning: Goal: Ability to manage health-related needs will improve 05/11/2023 0728 by Donne Hazel, LPN Outcome: Progressing 05/11/2023 0728 by Donne Hazel, LPN Outcome: Progressing   Problem: Clinical Measurements: Goal: Ability to maintain clinical measurements within normal limits will improve 05/11/2023 0728 by Donne Hazel, LPN Outcome: Progressing 05/11/2023 0728 by Donne Hazel, LPN Outcome: Progressing Goal: Will remain free from infection 05/11/2023 0728 by Donne Hazel, LPN Outcome: Progressing 05/11/2023 0728 by Donne Hazel, LPN Outcome: Progressing Goal: Diagnostic test results will improve 05/11/2023 0728 by Donne Hazel, LPN Outcome: Progressing 05/11/2023 0728 by Donne Hazel, LPN Outcome: Progressing Goal: Respiratory complications will improve 05/11/2023 0728 by Donne Hazel, LPN Outcome: Progressing 05/11/2023 0728 by Donne Hazel, LPN Outcome: Progressing Goal: Cardiovascular complication will be avoided 05/11/2023 0728 by Donne Hazel, LPN Outcome: Progressing 05/11/2023 0728 by Donne Hazel, LPN Outcome: Progressing   Problem: Activity: Goal: Risk for activity intolerance will decrease 05/11/2023 0728 by Donne Hazel, LPN Outcome: Progressing 05/11/2023 0728 by Donne Hazel, LPN Outcome: Progressing   Problem: Nutrition: Goal: Adequate nutrition will be maintained 05/11/2023 0728 by Donne Hazel, LPN Outcome: Progressing 05/11/2023 0728 by Donne Hazel,  LPN Outcome: Progressing   Problem: Coping: Goal: Level of anxiety will decrease 05/11/2023 0728 by Donne Hazel, LPN Outcome: Progressing 05/11/2023 0728 by Donne Hazel, LPN Outcome: Progressing   Problem: Elimination:  Goal: Will not experience complications related to bowel motility 05/11/2023 0728 by Donne Hazel, LPN Outcome: Progressing 05/11/2023 0728 by Donne Hazel, LPN Outcome: Progressing Goal: Will not experience complications related to urinary retention 05/11/2023 0728 by Donne Hazel, LPN Outcome: Progressing 05/11/2023 0728 by Donne Hazel, LPN Outcome: Progressing   Problem: Pain Managment: Goal: General experience of comfort will improve 05/11/2023 0728 by Donne Hazel, LPN Outcome: Progressing 05/11/2023 0728 by Donne Hazel, LPN Outcome: Progressing   Problem: Safety: Goal: Ability to remain free from injury will improve 05/11/2023 0728 by Donne Hazel, LPN Outcome: Progressing 05/11/2023 0728 by Donne Hazel, LPN Outcome: Progressing   Problem: Skin Integrity: Goal: Risk for impaired skin integrity will decrease 05/11/2023 0728 by Donne Hazel, LPN Outcome: Progressing 05/11/2023 0728 by Donne Hazel, LPN Outcome: Progressing   Problem: Education: Goal: Knowledge of condition will improve 05/11/2023 0728 by Donne Hazel, LPN Outcome: Progressing 05/11/2023 0728 by Donne Hazel, LPN Outcome: Progressing Goal: Individualized Educational Video(s) 05/11/2023 0728 by Donne Hazel, LPN Outcome: Progressing 05/11/2023 0728 by Donne Hazel, LPN Outcome: Progressing Goal: Individualized Newborn Educational Video(s) 05/11/2023 0728 by Donne Hazel, LPN Outcome: Progressing 05/11/2023 0728 by Donne Hazel, LPN Outcome: Progressing   Problem: Activity: Goal: Will verbalize the importance of balancing activity with adequate rest periods 05/11/2023 0728 by Donne Hazel, LPN Outcome: Progressing 05/11/2023 0728 by Donne Hazel,  LPN Outcome: Progressing Goal: Ability to tolerate increased activity will improve 05/11/2023 0728 by Donne Hazel, LPN Outcome: Progressing 05/11/2023 0728 by Donne Hazel, LPN Outcome: Progressing   Problem: Coping: Goal: Ability to identify and utilize available resources and services will improve 05/11/2023 0728 by Donne Hazel, LPN Outcome: Progressing 05/11/2023 0728 by Donne Hazel, LPN Outcome: Progressing   Problem: Life Cycle: Goal: Chance of risk for complications during the postpartum period will decrease 05/11/2023 0728 by Donne Hazel, LPN Outcome: Progressing 05/11/2023 0728 by Donne Hazel, LPN Outcome: Progressing   Problem: Role Relationship: Goal: Ability to demonstrate positive interaction with newborn will improve 05/11/2023 0728 by Donne Hazel, LPN Outcome: Progressing 05/11/2023 0728 by Donne Hazel, LPN Outcome: Progressing   Problem: Skin Integrity: Goal: Demonstration of wound healing without infection will improve 05/11/2023 0728 by Donne Hazel, LPN Outcome: Progressing 05/11/2023 0728 by Donne Hazel, LPN Outcome: Progressing   Problem: Education: Goal: Knowledge of condition will improve 05/11/2023 0728 by Donne Hazel, LPN Outcome: Progressing 05/11/2023 0728 by Donne Hazel, LPN Outcome: Progressing Goal: Individualized Educational Video(s) 05/11/2023 0728 by Donne Hazel, LPN Outcome: Progressing 05/11/2023 0728 by Donne Hazel, LPN Outcome: Progressing Goal: Individualized Newborn Educational Video(s) 05/11/2023 0728 by Donne Hazel, LPN Outcome: Progressing 05/11/2023 0728 by Donne Hazel, LPN Outcome: Progressing   Problem: Activity: Goal: Will verbalize the importance of balancing activity with adequate rest periods 05/11/2023 0728 by Donne Hazel, LPN Outcome: Progressing 05/11/2023 0728 by Donne Hazel, LPN Outcome: Progressing Goal: Ability to tolerate increased activity will improve 05/11/2023 0728  by Donne Hazel, LPN Outcome: Progressing 05/11/2023 0728 by Donne Hazel, LPN Outcome: Progressing   Problem: Coping: Goal: Ability to identify and utilize available resources and services will improve 05/11/2023 0728 by Donne Hazel, LPN Outcome: Progressing 05/11/2023 0728 by Donne Hazel, LPN Outcome: Progressing   Problem: Life Cycle: Goal: Chance of risk for complications during the postpartum period will decrease 05/11/2023  1610 by Donne Hazel, LPN Outcome: Progressing 05/11/2023 0728 by Donne Hazel, LPN Outcome: Progressing   Problem: Role Relationship: Goal: Ability to demonstrate positive interaction with newborn will improve 05/11/2023 0728 by Donne Hazel, LPN Outcome: Progressing 05/11/2023 0728 by Donne Hazel, LPN Outcome: Progressing   Problem: Skin Integrity: Goal: Demonstration of wound healing without infection will improve 05/11/2023 0728 by Donne Hazel, LPN Outcome: Progressing 05/11/2023 0728 by Donne Hazel, LPN Outcome: Progressing

## 2023-05-13 LAB — SURGICAL PATHOLOGY

## 2023-05-19 ENCOUNTER — Inpatient Hospital Stay (HOSPITAL_COMMUNITY): Payer: No Typology Code available for payment source

## 2023-06-04 ENCOUNTER — Telehealth (HOSPITAL_COMMUNITY): Payer: Self-pay

## 2023-06-04 NOTE — Telephone Encounter (Signed)
06/04/2023 1043  Name: Sherri Burton MRN: [id number] DOB: [birth year]  Reason for Call:  Transition of Care Hospital Discharge Call  Contact Status: Patient Contact Status: Unable to contact  Language assistant needed: Interpreter Mode: Interpreter Not Needed        Follow-Up Questions:    Inocente Salles Postnatal Depression Scale:  In the Past 7 Days:    PHQ2-9 Depression Scale:     Discharge Follow-up:    Post-discharge interventions: NA  Signature  Signe Colt

## 2024-07-30 ENCOUNTER — Ambulatory Visit
Admission: EM | Admit: 2024-07-30 | Discharge: 2024-07-30 | Disposition: A | Discharge status: Home / Self Care | Attending: Family Medicine | Admitting: Family Medicine

## 2024-07-30 ENCOUNTER — Encounter: Payer: Self-pay | Admitting: Emergency Medicine

## 2024-07-30 ENCOUNTER — Other Ambulatory Visit: Payer: Self-pay

## 2024-07-30 DIAGNOSIS — N760 Acute vaginitis: Secondary | ICD-10-CM | POA: Insufficient documentation

## 2024-07-30 NOTE — ED Triage Notes (Addendum)
 Pt reports vaginal itching, discharge since getting of of period. Pt reports was unable to get in with GYN and reports was told to come here. Is concerned for possible BV, reports hx of similar.   Vaginal self swab obtained post triage.

## 2024-07-30 NOTE — Discharge Instructions (Signed)
 We will let you know if anything comes back abnormal on your vaginal swab results and treat based on this.  In the meantime, you may start a female health probiotic and boric acid vaginal suppositories daily as needed.  Continue using only unscented gentle soaps in this area, no feminine wipes, washes or other potentially irritating products

## 2024-07-30 NOTE — ED Provider Notes (Signed)
 " [facility]    CSN: [id number] Arrival date & time: 07/30/24  1831      History   Chief Complaint Chief Complaint  Patient presents with   Vaginal Itching    HPI Sherri Burton is a 28 y.o. female.   Patient presenting today with several day history of vaginal itching, discharge since coming off her period.  Denies rashes or lesions, pelvic or abdominal pain, nausea, vomiting, fevers.  So far not trying anything over-the-counter for symptoms.  States it feels similar to another time where she had BV.  LMP 07/20/2024.  Currently breast-feeding.    Past Medical History:  Diagnosis Date   Medical history non-contributory     Patient Active Problem List   Diagnosis Date Noted   Gestational hypertension 05/09/2023   Iron  deficiency anemia during pregnancy 05/09/2023   Vacuum-assisted vaginal delivery 05/09/2023   PPH (postpartum hemorrhage) 05/09/2023   Chorioamnionitis 05/09/2023   Second degree perineal laceration 05/09/2023   Postpartum care following vaginal delivery 9/27 05/09/2023   Iron  deficiency anemia 05/09/2023   Normal labor 05/08/2023    Past Surgical History:  Procedure Laterality Date   NO PAST SURGERIES     PARATHYROIDECTOMY  11/2021    OB History     Gravida  2   Para  1   Term  1   Preterm      AB      Living  1      SAB      IAB      Ectopic      Multiple  0   Live Births  1            Home Medications    Prior to Admission medications  Medication Sig Start Date End Date Taking? Authorizing Provider  acetaminophen  (TYLENOL ) 325 MG tablet Take 2 tablets (650 mg total) by mouth every 4 (four) hours as needed (for pain scale < 4). 05/11/23   Barbette Knock, MD  ibuprofen  (ADVIL ) 600 MG tablet Take 1 tablet (600 mg total) by mouth every 6 (six) hours. 05/11/23   Barbette Knock, MD  Prenatal Vit-Fe Fumarate-FA (PRENATAL PO) Take 1 tablet by mouth daily.    [provider]  senna-docusate  (SENOKOT-S) 8.6-50 MG tablet Take 2 tablets by mouth daily. 05/11/23   Barbette Knock, MD    Family History Family History  Problem Relation Age of Onset   Cancer Mother    Healthy Father     Social History Social History[1]   Allergies   Patient has no known allergies.   Review of Systems Review of Systems Per HPI  Physical Exam Triage Vital Signs ED Triage Vitals  Encounter Vitals Group     BP 07/30/24 1941 129/85     Girls Systolic BP Percentile --      Girls Diastolic BP Percentile --      Boys Systolic BP Percentile --      Boys Diastolic BP Percentile --      Pulse Rate 07/30/24 1941 78     Resp 07/30/24 1941 20     Temp 07/30/24 1941 98.1 F (36.7 C)     Temp Source 07/30/24 1941 Oral     SpO2 07/30/24 1941 96 %     Weight --      Height --      Head Circumference --      Peak Flow --      Pain Score 07/30/24 1939 0  Pain Loc --      Pain Education --      Exclude from Growth Chart --    No data found.  Updated Vital Signs BP 129/85 (BP Location: Right Arm)   Pulse 78   Temp 98.1 F (36.7 C) (Oral)   Resp 20   LMP 07/20/2024   SpO2 96%   Breastfeeding Yes   Visual Acuity Right Eye Distance:   Left Eye Distance:   Bilateral Distance:    Right Eye Near:   Left Eye Near:    Bilateral Near:     Physical Exam Vitals and nursing note reviewed.  Constitutional:      Appearance: Normal appearance. She is not ill-appearing.  HENT:     Head: Atraumatic.  Eyes:     Extraocular Movements: Extraocular movements intact.     Conjunctiva/sclera: Conjunctivae normal.  Cardiovascular:     Rate and Rhythm: Normal rate.  Pulmonary:     Effort: Pulmonary effort is normal.  Abdominal:     General: Bowel sounds are normal. There is no distension.     Palpations: Abdomen is soft.     Tenderness: There is no abdominal tenderness. There is no right CVA tenderness, left CVA tenderness or guarding.  Genitourinary:    Comments: GU exam deferred, self  swab performed Musculoskeletal:        General: Normal range of motion.     Cervical back: Normal range of motion and neck supple.  Skin:    General: Skin is warm and dry.  Neurological:     Mental Status: She is alert and oriented to person, place, and time.  Psychiatric:        Mood and Affect: Mood normal.        Thought Content: Thought content normal.        Judgment: Judgment normal.      UC Treatments / Results  Labs (all labs ordered are listed, but only abnormal results are displayed) Labs Reviewed  CERVICOVAGINAL ANCILLARY ONLY    EKG   Radiology No results found.  Procedures Procedures (including critical care time)  Medications Ordered in UC Medications - No data to display  Initial Impression / Assessment and Plan / UC Course  I have reviewed the triage vital signs and the nursing notes.  Pertinent labs & imaging results that were available during my care of the patient were reviewed by me and considered in my medical decision making (see chart for details).     Vaginal swab pending, will treat based on results.  Discussed supportive over-the-counter medications at home care in the meantime.  Final Clinical Impressions(s) / UC Diagnoses   Final diagnoses:  Acute vaginitis     Discharge Instructions      We will let you know if anything comes back abnormal on your vaginal swab results and treat based on this.  In the meantime, you may start a female health probiotic and boric acid vaginal suppositories daily as needed.  Continue using only unscented gentle soaps in this area, no feminine wipes, washes or other potentially irritating products    ED Prescriptions   None    PDMP not reviewed this encounter.    [1]  Social History Tobacco Use   Smoking status: Never  Substance Use Topics   Alcohol use: Not Currently    Alcohol/week: 1.0 standard drink of alcohol    Types: 1 Standard drinks or equivalent per week   Drug use: Never  Stuart Vernell Norris, [place] 07/30/24 2019  "

## 2024-08-02 ENCOUNTER — Ambulatory Visit (HOSPITAL_COMMUNITY): Payer: Self-pay

## 2024-08-02 LAB — CERVICOVAGINAL ANCILLARY ONLY
Bacterial Vaginitis (gardnerella): POSITIVE — AB
Candida Glabrata: NEGATIVE
Candida Vaginitis: NEGATIVE
Chlamydia: NEGATIVE
Comment: NEGATIVE
Comment: NEGATIVE
Comment: NEGATIVE
Comment: NEGATIVE
Comment: NEGATIVE
Comment: NORMAL
Neisseria Gonorrhea: NEGATIVE
Trichomonas: NEGATIVE

## 2024-08-02 MED ORDER — METRONIDAZOLE 500 MG PO TABS: 500.0000 mg | ORAL_TABLET | Freq: Two times a day (BID) | ORAL | 0 refills | Status: AC
